# Patient Record
Sex: Female | Born: 1975 | Race: White | Hispanic: No | Marital: Married | Smoking: Former smoker
Health system: Southern US, Community
[De-identification: ages and names within clinical notes are randomized; demographics above are authoritative.]

## PROBLEM LIST (undated history)

## (undated) DIAGNOSIS — E119 Type 2 diabetes mellitus without complications: Secondary | ICD-10-CM

## (undated) DIAGNOSIS — I10 Essential (primary) hypertension: Secondary | ICD-10-CM

## (undated) HISTORY — DX: Essential (primary) hypertension: I10

---

## 2002-06-15 HISTORY — PX: DILATION AND CURETTAGE OF UTERUS: SHX78

## 2003-01-13 HISTORY — PX: CERVICAL CERCLAGE: SHX1329

## 2006-12-22 ENCOUNTER — Ambulatory Visit: Payer: Self-pay | Admitting: Internal Medicine

## 2007-03-24 ENCOUNTER — Ambulatory Visit: Payer: Self-pay | Admitting: Gynecology

## 2007-03-25 ENCOUNTER — Encounter: Admission: RE | Admit: 2007-03-25 | Discharge: 2007-03-25 | Payer: Self-pay | Admitting: Gynecology

## 2007-05-04 ENCOUNTER — Encounter (INDEPENDENT_AMBULATORY_CARE_PROVIDER_SITE_OTHER): Payer: Self-pay | Admitting: Gynecology

## 2007-05-04 ENCOUNTER — Ambulatory Visit: Payer: Self-pay | Admitting: Gynecology

## 2007-08-23 ENCOUNTER — Ambulatory Visit: Payer: Self-pay

## 2007-10-10 ENCOUNTER — Ambulatory Visit: Payer: Self-pay | Admitting: Gynecology

## 2008-06-13 ENCOUNTER — Ambulatory Visit: Payer: Self-pay | Admitting: Sports Medicine

## 2009-05-29 ENCOUNTER — Ambulatory Visit: Payer: Self-pay | Admitting: Family Medicine

## 2010-06-11 ENCOUNTER — Other Ambulatory Visit (HOSPITAL_COMMUNITY)
Admission: RE | Admit: 2010-06-11 | Discharge: 2010-06-11 | Disposition: A | Discharge status: Home / Self Care | Payer: 59 | Source: Ambulatory Visit | Attending: Family Medicine | Admitting: Family Medicine

## 2010-06-11 ENCOUNTER — Encounter: Payer: Self-pay | Admitting: Family Medicine

## 2010-06-11 ENCOUNTER — Ambulatory Visit (INDEPENDENT_AMBULATORY_CARE_PROVIDER_SITE_OTHER): Payer: 59 | Admitting: Obstetrics & Gynecology

## 2010-06-11 DIAGNOSIS — Z124 Encounter for screening for malignant neoplasm of cervix: Secondary | ICD-10-CM

## 2010-06-11 DIAGNOSIS — Z01419 Encounter for gynecological examination (general) (routine) without abnormal findings: Secondary | ICD-10-CM

## 2010-06-11 LAB — CONVERTED CEMR LAB: TSH: 1.332 microintl units/mL (ref 0.350–4.500)

## 2010-07-04 NOTE — Assessment & Plan Note (Signed)
NAME:  Melinda Sanders, Melinda Sanders                  ACCOUNT NO.:  [id number]  MEDICAL RECORD NO.:  [id number]           PATIENT TYPE:  LOCATION:  [facility] at [facility]           FACILITY:  PHYSICIAN:  Tinnie Gens, MD             DATE OF BIRTH:  DATE OF SERVICE:  06/11/2010                                 CLINIC NOTE  CHIEF COMPLAINT:  Yearly exam.  HISTORY OF PRESENT ILLNESS:  The patient is a 35 year old gravida 2, para 0-1-1-1 who has complicated OB history that included a 20-week loss followed by a 31-week PPROM delivery after prophylactic cerclage.  The patient underwent cesarean delivery at that time.  She had a D and C after her 20-week loss.  At her last yearly approximately 1 year ago, we removed her IUD.  She was having regular cycles pretty much since that happened and has been trying to conceive without any lot.  Her cycles have been fairly regular with the exception of one cycle that was 9 days late in January with a negative EPT.  She then had a normal cycle in February and she is about day 20 today from that.  PAST MEDICAL HISTORY:  Depression, migraine headaches, allergic rhinitis.  PAST SURGICAL HISTORY:  D and C for 20-week loss, cerclage last pregnancy and primary low-transverse C-section for labor with cerclage in place.  MEDICATIONS:  Singulair one p.o. daily and phentermine.  ALLERGIES:  No known drug allergies.  She is not allergic to latex.  OBSTETRICAL HISTORY:  She is G1 with a 20-week loss, G2, cerclage at 12 weeks, PPROM at 30 weeks with delivery via C-section.  GYN HISTORY:  No history of abnormal Pap smear.  FAMILY HISTORY:  Hypertension in her father.  Diabetes in mom and grandmom.  Breast cancer in maternal and paternal grandmothers.  SOCIAL HISTORY:  She works.  She denies tobacco, drug use.  She is a social alcohol user.  She is married.  She and her husband have had some difficulties, but are back home for approximately 3 years.  REVIEW OF SYSTEMS:   Fourteen-point review of systems reviewed.  She denies headache, vision changes, fevers, chills, nausea, vomiting, diarrhea, constipation, blood in her stool, blood in her urine, chest pain, shortness of breath, abdominal pain.  PHYSICAL EXAMINATION:  VITAL SIGNS:  Her blood pressure today is 143/102, weight is 190. GENERAL:  She is a well-developed and well-nourished female in no acute distress. HEENT:  Normocephalic, atraumatic.  Sclerae anicteric. NECK:  Supple.  Normal thyroid. LUNGS:  Clear bilaterally. CV:  Regular rate and rhythm without rubs, gallops, or murmurs. ABDOMEN:  Soft, nontender, and nondistended. EXTREMITIES:  No cyanosis, clubbing, or edema, 2+ distal pulses. BREASTS:  Symmetric with everted nipples.  No masses.  No supraclavicular or axillary adenopathy. GU:  In the external genitalia, she looks like she has chronic yeast infection with skin discoloration, pinkness, and a little bit of lichenification.  BUS is normal.  Vagina is pink and rugated.  Cervix is nulliparous without lesions.  Uterus was mildly tender anteriorly. Really cannot be well defined on bimanual given body habitus.  The adnexa did not feel enlarged but  were also not really well appreciated either.  IMPRESSION: 1. GYN exam with Pap. 2. Elevated blood pressure. 3. Secondary infertility. 4. Tinea cruris.  PLAN: 1. Pap smear today. 2. Check itraconazole for yeast. 3. Check HSG. 4. Check semen analysis. 5. Check labs to include a TSH, progesterone, AMH at day 21     essentially progesterone. 6. PCP referral for elevated blood pressure.  Once we get these laboratories and studies back, we will sit down again with the patient and decide about referral to Reproductive Endocrinology.          ______________________________ Tinnie Gens, MD    TP/MEDQ  D:  06/11/2010  T:  06/12/2010  Job:  [id number]

## 2010-07-24 ENCOUNTER — Inpatient Hospital Stay (HOSPITAL_COMMUNITY)
Admission: AD | Admit: 2010-07-24 | Discharge: 2010-07-25 | Disposition: A | Discharge status: Home / Self Care | Payer: 59 | Source: Ambulatory Visit | Attending: Obstetrics and Gynecology | Admitting: Obstetrics and Gynecology

## 2010-07-24 DIAGNOSIS — O200 Threatened abortion: Secondary | ICD-10-CM

## 2010-07-25 ENCOUNTER — Inpatient Hospital Stay (HOSPITAL_COMMUNITY): Payer: 59

## 2010-07-25 ENCOUNTER — Encounter (HOSPITAL_COMMUNITY): Payer: Self-pay | Admitting: Radiology

## 2010-07-25 LAB — CBC
HCT: 36.6 % (ref 36.0–46.0)
MCH: 30.4 pg (ref 26.0–34.0)
MCHC: 35 g/dL (ref 30.0–36.0)
MCV: 86.9 fL (ref 78.0–100.0)
RBC: 4.21 MIL/uL (ref 3.87–5.11)
RDW: 13.4 % (ref 11.5–15.5)

## 2010-08-04 ENCOUNTER — Encounter (HOSPITAL_COMMUNITY)
Admission: RE | Admit: 2010-08-04 | Discharge: 2010-08-04 | Disposition: A | Discharge status: Home / Self Care | Payer: 59 | Source: Ambulatory Visit | Attending: Obstetrics and Gynecology | Admitting: Obstetrics and Gynecology

## 2010-08-04 LAB — CBC
HCT: 40.3 % (ref 36.0–46.0)
Hemoglobin: 13.7 g/dL (ref 12.0–15.0)
MCV: 88.6 fL (ref 78.0–100.0)
WBC: 8 10*3/uL (ref 4.0–10.5)

## 2010-08-06 ENCOUNTER — Ambulatory Visit (HOSPITAL_COMMUNITY)
Admission: RE | Admit: 2010-08-06 | Discharge: 2010-08-06 | Disposition: A | Discharge status: Home / Self Care | Payer: 59 | Source: Ambulatory Visit | Attending: Obstetrics and Gynecology | Admitting: Obstetrics and Gynecology

## 2010-08-06 DIAGNOSIS — O3430 Maternal care for cervical incompetence, unspecified trimester: Secondary | ICD-10-CM | POA: Insufficient documentation

## 2010-08-06 DIAGNOSIS — Z01812 Encounter for preprocedural laboratory examination: Secondary | ICD-10-CM | POA: Insufficient documentation

## 2010-08-06 DIAGNOSIS — Z01818 Encounter for other preprocedural examination: Secondary | ICD-10-CM | POA: Insufficient documentation

## 2010-08-06 HISTORY — PX: CERVICAL CERCLAGE: SHX1329

## 2010-08-26 ENCOUNTER — Other Ambulatory Visit (HOSPITAL_COMMUNITY): Payer: Self-pay | Admitting: Obstetrics and Gynecology

## 2010-08-26 DIAGNOSIS — R772 Abnormality of alphafetoprotein: Secondary | ICD-10-CM

## 2010-08-26 NOTE — Assessment & Plan Note (Signed)
NAME:  Melinda Sanders, Melinda Sanders                  ACCOUNT NO.:  [id number]   MEDICAL RECORD NO.:  [id number]          PATIENT TYPE:  POB   LOCATION:  [facility] at [facility]         FACILITY:  [facility]   PHYSICIAN:  Tinnie Gens, MD        DATE OF BIRTH:  12/05/1975   DATE OF SERVICE:  05/29/2009                                  CLINIC NOTE   CHIEF COMPLAINT:  Yearly exam.   HISTORY OF PRESENT ILLNESS:  The patient is a 35 year old gravida 2,  para 0-1-1-1 who has a very complicated OB history and has an IUD in at  present.  She mentioned conceiving another child.  Her last child was  born 6 years ago at 31 weeks after a PPROM.  She had a cerclage at that  pregnancy because she has a history of her prior 20-week loss.  A  lengthy discussion was had with the patient about this and her risks and  the risks involved.  I think she is adequately prepared and knows what  to expect.  We did discuss 17P, preconception counseling, getting off  her medications and starting prenatal vitamins prior to conception.  She  thinks she wants to try to start conceiving in May or June 2011.  She  has no cycle with her IUD and she has pain rarely and otherwise is  without complaint today.   PAST MEDICAL HISTORY:  She has depression, some migraine headaches and  allergies.   MEDICATIONS:  She is on;  1. Singulair 1 p.o. daily.  2. Zoloft 50 mg 1 p.o. daily.   ALLERGIES:  None known.  She is not allergic to latex.   PAST SURGICAL HISTORY:  She had a D and C for the placenta after a 20-  week loss, Shirodkar cerclage with her last pregnancy and a primary low  transverse C-section for labor and a cerclage in place.   SOCIAL HISTORY:  The patient works for a [employer].  She  denies tobacco, social alcohol use.   FAMILY HISTORY:  Significant for hypertension in her father, diabetes in  her mom and grand mom.  Breast cancer in a maternal and paternal  grandmothers.   OBSTETRICAL HISTORY:  G1, 20-week loss due  to cerclage at 12 weeks,  PPROM at 30 weeks with delivery via C-section.   GYNECOLOGIC HISTORY:  No history of abnormal Pap smear.   14-point review of systems reviewed.  She denies fevers, chills,  headache, vision changes, nausea, vomiting, diarrhea, constipation,  blood in her stool, blood in her urine.   PHYSICAL EXAMINATION:  VITAL SIGNS:  Today, weight is 207 pounds, blood  pressure is 144/100, pulse is 88, height is 5 feet 4 inches.  GENERAL:  She is a moderately obese female in no acute distress.  HEENT:  Normocephalic, atraumatic.  Sclerae anicteric.  NECK:  Supple.  Normal thyroid.  LUNGS:  Clear bilaterally.  CV:  Regular rate and rhythm.  No rubs, gallops, or murmurs.  ABDOMEN:  Soft, nontender, nondistended.  EXTREMITIES:  No cyanosis, clubbing or edema.  BREASTS:  Symmetric with everted nipples.  No masses.  No  supraclavicular or axillary  adenopathy.  GU:  Normal external female genitalia.  BUS is normal.  Vagina is pink  and rugated.  Cervix is nulliparous without lesions.  Uterus is small,  anteverted.  No adnexal mass or tenderness.   PROCEDURE:  IUD string was easily visualized, was grasped with a ring  forceps and removed without difficulty.   IMPRESSION:  1. Gynecologic exam with Pap smear.  2. Intrauterine device removal.   PLAN:  Preconception counseling done.  Followup Pap next year.  Come  back with pregnancy.           ______________________________  Tinnie Gens, MD     TP/MEDQ  D:  05/29/2009  T:  05/30/2009  Job:  [id number]

## 2010-08-26 NOTE — Op Note (Signed)
  NAME:  Melinda Sanders, Melinda Sanders                  ACCOUNT NO.:  [id number]  MEDICAL RECORD NO.:  [id number]           PATIENT TYPE:  O  LOCATION:  [facility]                          FACILITY:  [facility]  PHYSICIAN:  Guy Sandifer. Henderson Cloud, M.D. DATE OF BIRTH:  May 23, 1975  DATE OF PROCEDURE:  08/06/2010 DATE OF DISCHARGE:                              OPERATIVE REPORT   PREOPERATIVE DIAGNOSIS:  Incompetent cervix at approximately 73 weeks' estimated gestational age.  POSTOPERATIVE DIAGNOSIS:  Incompetent cervix at approximately 11 weeks' estimated gestational age.  PROCEDURE:  McDonald cervical cerclage.  SURGEON:  Guy Sandifer. Henderson Cloud, MD  ANESTHESIA:  Spinal.  ESTIMATED BLOOD LOSS:  Minimal.  INDICATIONS AND CONSENT:  This patient is a 35 year old married white female at approximately 104 weeks' estimated gestational age with known incompetent cervix.  Recent ultrasound in the office revealed a cervix measuring 4.7 cm in length.  The patient is without bleeding or leaking. Options have been discussed and cervical cerclage has been reviewed preoperatively.  Potential risks and complications have been reviewed preoperatively including but limited to infection, organ damage, bleeding requiring transfusion of blood products with HIV and hepatitis acquisition, DVT, PE, and pneumonia.  Possibility of ruptured membranes or infection leading to pregnancy loss have been reviewed.  Additional complications of incompetent cervix, including early admission, premature delivery, prolonged bed rest, have been reviewed as well.  All questions have been answered, and consent is signed on the chart.  PROCEDURE:  Fetal heartbeat was noted in the preoperative holding area. The patient was transferred to the operating room.  She was identified. Spinal anesthetic was placed.  She was placed in the dorsal lithotomy position.  Time-out was undertaken.  She was prepped, bladder straight catheterized, and she was draped in a sterile  fashion.  Exam reveals cervix to be closed and thick.  Weighted retractor was placed.  Using a 5-mm Mersilene band, a cerclage was placed beginning at the 12 o'clock position in a counterclockwise direction, again exiting at the 12 o'clock position.  This was done without difficulty.  This was tied down and the excess length was trimmed.  Good hemostasis was noted.  Exam reveals the cervix to be closed and thick.  All counts correct, and the patient is taken to recovery room in stable condition.     Guy Sandifer Henderson Cloud, M.D.     JET/MEDQ  D:  08/06/2010  T:  08/06/2010  Job:  [id number]  Electronically Signed by Harold Hedge M.D. on 08/26/2010 01:50:38 PM

## 2010-08-26 NOTE — Assessment & Plan Note (Signed)
NAME:  Melinda Sanders, Melinda Sanders                  ACCOUNT NO.:  [id number]   MEDICAL RECORD NO.:  [id number]          PATIENT TYPE:  POB   LOCATION:  [facility] at [facility]         FACILITY:  [facility]   PHYSICIAN:  Ginger Carne, MD DATE OF BIRTH:  27-Mar-1976   DATE OF SERVICE:                                  CLINIC NOTE   [name] is here today for placement of a Mirena intrauterine device.  She has noted that when she is off of the patches or oral contraception,  her blood pressure reverts to normal.  It was determined that it is  possible, that her hypertension is contraceptive related.  For this  reason,  the patient stopped the NuvaRing, which she had been on most  recently, several days ago and a Mirena IUD was placed today without  difficulty.  She sounded to 8 cm.  I have asked her to have her blood  pressure rechecked in approximately 3 weeks, here at the office to see  if her pressure has responded.  At the moment, she is not on  antihypertensive medication.  Today's blood pressure was 139/91, and  once her blood pressure is re-determined, consideration will be given as  whether she is a candidate for antihypertensive medication.           ______________________________  Ginger Carne, MD     SHB/MEDQ  D:  10/10/2007  T:  10/11/2007  Job:  [id number]

## 2010-10-01 ENCOUNTER — Other Ambulatory Visit (HOSPITAL_COMMUNITY): Payer: Self-pay | Admitting: Obstetrics and Gynecology

## 2010-10-01 ENCOUNTER — Ambulatory Visit (HOSPITAL_COMMUNITY)
Admission: RE | Admit: 2010-10-01 | Discharge: 2010-10-01 | Disposition: A | Discharge status: Home / Self Care | Payer: 59 | Source: Ambulatory Visit | Attending: Obstetrics and Gynecology | Admitting: Obstetrics and Gynecology

## 2010-10-01 DIAGNOSIS — R772 Abnormality of alphafetoprotein: Secondary | ICD-10-CM

## 2010-10-01 DIAGNOSIS — O09529 Supervision of elderly multigravida, unspecified trimester: Secondary | ICD-10-CM | POA: Insufficient documentation

## 2010-10-01 DIAGNOSIS — O358XX0 Maternal care for other (suspected) fetal abnormality and damage, not applicable or unspecified: Secondary | ICD-10-CM | POA: Insufficient documentation

## 2010-10-24 ENCOUNTER — Inpatient Hospital Stay (HOSPITAL_COMMUNITY)
Admission: AD | Admit: 2010-10-24 | Discharge: 2010-10-27 | DRG: 782 | Disposition: A | Discharge status: Home / Self Care | Payer: 59 | Source: Ambulatory Visit | Attending: Obstetrics and Gynecology | Admitting: Obstetrics and Gynecology

## 2010-10-24 ENCOUNTER — Encounter (HOSPITAL_COMMUNITY): Payer: Self-pay | Admitting: *Deleted

## 2010-10-24 DIAGNOSIS — O3430 Maternal care for cervical incompetence, unspecified trimester: Principal | ICD-10-CM | POA: Diagnosis present

## 2010-10-24 MED ORDER — PRENATAL PLUS 27-1 MG PO TABS
1.0000 | ORAL_TABLET | Freq: Every day | ORAL | Status: DC
  Administered 2010-10-24 – 2010-10-27 (×4): 1 via ORAL
  Filled 2010-10-24 (×4): qty 1

## 2010-10-24 MED ORDER — LACTATED RINGERS IV SOLN
INTRAVENOUS | Status: DC
  Administered 2010-10-24 – 2010-10-25 (×3): via INTRAVENOUS

## 2010-10-24 MED ORDER — IBUPROFEN 800 MG PO TABS
800.0000 mg | ORAL_TABLET | Freq: Three times a day (TID) | ORAL | Status: DC
  Administered 2010-10-24 – 2010-10-27 (×7): 800 mg via ORAL
  Filled 2010-10-24 (×7): qty 1

## 2010-10-24 MED ORDER — IBUPROFEN 800 MG PO TABS
800.0000 mg | ORAL_TABLET | Freq: Three times a day (TID) | ORAL | Status: DC
  Administered 2010-10-24 (×2): 800 mg via ORAL
  Filled 2010-10-24 (×2): qty 1

## 2010-10-24 MED ORDER — CALCIUM CARBONATE ANTACID 500 MG PO CHEW: 2.0000 | CHEWABLE_TABLET | ORAL | Status: DC | PRN

## 2010-10-24 MED ORDER — ACETAMINOPHEN 325 MG PO TABS: 650.0000 mg | ORAL_TABLET | ORAL | Status: DC | PRN

## 2010-10-24 MED ORDER — DOCUSATE SODIUM 100 MG PO CAPS
100.0000 mg | ORAL_CAPSULE | Freq: Every day | ORAL | Status: DC
  Administered 2010-10-24 – 2010-10-27 (×4): 100 mg via ORAL
  Filled 2010-10-24 (×4): qty 1

## 2010-10-24 MED ORDER — ZOLPIDEM TARTRATE 10 MG PO TABS
10.0000 mg | ORAL_TABLET | Freq: Every evening | ORAL | Status: DC | PRN
  Administered 2010-10-24 – 2010-10-26 (×2): 10 mg via ORAL
  Filled 2010-10-24 (×3): qty 1

## 2010-10-24 NOTE — H&P (Signed)
Melinda Sanders is a 89 you G3p1 at 48 1/7 seen in office today with [place] eval of Incompetent cervix.  At 18 weeks had normal [place] length of 4cm.  Today US showed funneling of membranes thru cerclage (placed at 11 weeks) but had 1 cm of cervix left beyond the funnel that was closed.  Pelvic exam revealed 3-4 cm length and closed cervix with stitch in place.  Her history is significant for C/S at 33 weeks for PPROM and a 17 week loss.  She had a cerclage placed at 11 weeks this pregnancy.  She desires aggressive inpatient management  IMP  Plan Admit to antenatal for Trend[place]burg, BR, Motrin 800 TID x 3 days and rescan in 3 days for treatement of cervical change with funneling.  Cerclage in place. Also of note, previous US showed an anterior placenta previa that was not seen today on vaginal Korea of cervix. Candice Camp, MD

## 2010-10-25 MED ORDER — SODIUM CHLORIDE 0.9 % IJ SOLN
3.0000 mL | Freq: Two times a day (BID) | INTRAMUSCULAR | Status: DC
  Administered 2010-10-25: 3 mL via INTRAVENOUS

## 2010-10-25 NOTE — Progress Notes (Signed)
RN to bedside to given 1800 medication.  Doppler fetal heart rate 147 bpm, pt c/o not able to use BSC for bm, desires to get up and go to BR.  toco disconnected so pt. Can go to BR for BM.  Fetal HR checked prior to pt. Going to BR.

## 2010-10-25 NOTE — Progress Notes (Signed)
  Pt is without complaints.  Reports GFM and no Ctxs VSSAF No Ctxs FHR + Abd soft/Gravid Inc Cx with change Continue present care DL

## 2010-10-26 NOTE — Progress Notes (Signed)
  Pt without complaints VSSAF FHR + Ctxs none Abd soft, gravid Incompetent cervix.  Cont Motrin x 3 days (finish tomorrow) Korea of cervix in am Cerclage in place DL

## 2010-10-27 ENCOUNTER — Encounter (HOSPITAL_COMMUNITY): Payer: 59

## 2010-10-27 ENCOUNTER — Inpatient Hospital Stay (HOSPITAL_COMMUNITY): Payer: 59

## 2010-10-27 NOTE — Discharge Summary (Signed)
Obstetric Discharge Summary Reason for Admission: shortened cervix Prenatal Procedures: bedrest, cervical ultrasound     Hemoglobin  Date Value Range Status  08/04/2010 13.7  12.0-15.0 (g/dL) Final     HCT  Date Value Range Status  08/04/2010 40.3  36.0-46.0 (%) Final    Discharge Diagnoses: Incompetent cervix  Discharge Information: Date: 10/27/2010 Activity: pelvic rest and bedrest Diet: routine Medications: PNV and Colace Condition: stable Instructions: refer to practice specific booklet Discharge to: home      Melinda Sanders 10/27/2010, 8:55 AM

## 2010-10-27 NOTE — Progress Notes (Signed)
UR chart review completed.  

## 2010-10-27 NOTE — Progress Notes (Signed)
Radiology tech at the bedside for U/S transvaginal.

## 2010-10-27 NOTE — Progress Notes (Signed)
Discharge instructions reviewed with patient and (spouse at the bedside).  Copy given along with MD follow up instructions. Pt. Verbalized understanding of [facility] instructions, along with other discharge instructions (vaginal leaking, bleeding, ctxs, and decreased fetal movement etc... - copy given.

## 2010-10-27 NOTE — Progress Notes (Signed)
Pt. Removed from Toco - to shower (shower chair used). (VO received - OK for pt. To take shower - in chair; per Dr. Renaldo Fiddler.

## 2010-10-27 NOTE — Progress Notes (Signed)
Pt w/out complaints.  Good FM.  No ctx, vb, or LOF.  AFVSS, +FHT Gen: NAD Abd:  Gravid, NT PV:  Deferred  Pelvic US:  Cervical length unchanged per preliminary report.  Final report pending.   A/P:  D/C home Bedrest F/u office for rpt cervix length this week

## 2010-10-28 ENCOUNTER — Encounter (HOSPITAL_COMMUNITY): Payer: 59

## 2010-10-29 ENCOUNTER — Ambulatory Visit (HOSPITAL_COMMUNITY): Admission: RE | Admit: 2010-10-29 | Payer: 59 | Source: Ambulatory Visit

## 2010-10-30 ENCOUNTER — Inpatient Hospital Stay (HOSPITAL_COMMUNITY)
Admission: AD | Admit: 2010-10-30 | Discharge: 2011-01-16 | DRG: 765 | Disposition: A | Discharge status: Home / Self Care | Payer: 59 | Attending: Obstetrics and Gynecology | Admitting: Obstetrics and Gynecology

## 2010-10-30 ENCOUNTER — Encounter (HOSPITAL_COMMUNITY): Payer: Self-pay | Admitting: *Deleted

## 2010-10-30 DIAGNOSIS — O26879 Cervical shortening, unspecified trimester: Secondary | ICD-10-CM | POA: Diagnosis present

## 2010-10-30 DIAGNOSIS — O3430 Maternal care for cervical incompetence, unspecified trimester: Principal | ICD-10-CM | POA: Diagnosis present

## 2010-10-30 DIAGNOSIS — N883 Incompetence of cervix uteri: Secondary | ICD-10-CM

## 2010-10-30 DIAGNOSIS — Z98891 History of uterine scar from previous surgery: Secondary | ICD-10-CM

## 2010-10-30 DIAGNOSIS — O34219 Maternal care for unspecified type scar from previous cesarean delivery: Secondary | ICD-10-CM | POA: Diagnosis present

## 2010-10-30 DIAGNOSIS — Z302 Encounter for sterilization: Secondary | ICD-10-CM

## 2010-10-30 MED ORDER — PRENATAL PLUS 27-1 MG PO TABS: 1.0000 | ORAL_TABLET | Freq: Every day | ORAL | Status: DC

## 2010-10-30 MED ORDER — PRENATAL PLUS 27-1 MG PO TABS
1.0000 | ORAL_TABLET | Freq: Every day | ORAL | Status: DC
  Administered 2010-10-30 – 2011-01-12 (×75): 1 via ORAL
  Filled 2010-10-30 (×77): qty 1

## 2010-10-30 MED ORDER — CALCIUM CARBONATE ANTACID 500 MG PO CHEW
2.0000 | CHEWABLE_TABLET | ORAL | Status: DC | PRN
  Administered 2010-11-25 – 2011-01-02 (×5): 400 mg via ORAL
  Filled 2010-10-30 (×5): qty 2

## 2010-10-30 MED ORDER — DOCUSATE SODIUM 100 MG PO CAPS
100.0000 mg | ORAL_CAPSULE | Freq: Every day | ORAL | Status: DC
  Administered 2010-10-30 – 2011-01-12 (×71): 100 mg via ORAL
  Filled 2010-10-30 (×74): qty 1

## 2010-10-30 MED ORDER — ZOLPIDEM TARTRATE 10 MG PO TABS
10.0000 mg | ORAL_TABLET | Freq: Every evening | ORAL | Status: DC | PRN
  Administered 2010-11-03 – 2011-01-12 (×52): 10 mg via ORAL
  Filled 2010-10-30 (×54): qty 1

## 2010-10-30 MED ORDER — HYDROXYPROGESTERONE CAPROATE 250 MG/ML IM OIL
250.0000 mg | TOPICAL_OIL | INTRAMUSCULAR | Status: AC
  Administered 2010-10-31 – 2011-01-02 (×10): 250 mg via INTRAMUSCULAR
  Filled 2010-10-30 (×12): qty 1

## 2010-10-30 MED ORDER — DOCUSATE SODIUM 100 MG PO CAPS: 100.0000 mg | ORAL_CAPSULE | Freq: Every day | ORAL | Status: DC

## 2010-10-30 MED ORDER — ACETAMINOPHEN 325 MG PO TABS
650.0000 mg | ORAL_TABLET | ORAL | Status: DC | PRN
  Administered 2010-11-04 – 2011-01-03 (×9): 650 mg via ORAL
  Filled 2010-10-30 (×9): qty 2

## 2010-10-30 MED ORDER — BETAMETHASONE SOD PHOS & ACET 6 (3-3) MG/ML IJ SUSP: 12.0000 mg | INTRAMUSCULAR | Status: DC

## 2010-10-30 NOTE — H&P (Signed)
   Chief complaint: Incompetent cervix, shortened cervix.  History of present illness:  35 year old G3 P0 111 ED D. 02/26/2011, approximately 23 weeks, this patient was hospitalized 713 when a routine ultrasound in our office demonstrated that her cervix had decreased from 4.9 cm to a cervical length of 1.3 cm. She previously had a cerclage placed at this pregnancy of 08/06/2010 by Dr. Huntley Dec. She was observed over the weekend, discharged this past Monday.  She presented to the office for routine check today with her husband and while her cervix was stable on exam, namely 1.3 cm with some funneling the, the cerclage was intact but they would both feel better about hospital care until past the point of viability. She is admitted for monitoring, betamethasone at 24 weeks, and her weekly progesterone.  Past medical history:  Pregnancy history she had a D&E in 2004 for a 17 week fetal loss. Cesarean section 2005 31 weeks for P. prom she did have a cerclage with that pregnancy. For the remainder of her PMH please see the [facility] statical form. Physical exam Temp 98 2 blood pressure 130/72 HEENT unremarkable, neck supple without masses.  Lungs clear Cardiovascular: Regular rate and rhythm without murmurs rubs or gallops noted. Breasts: Not examined Abdominal exam: Fetal heart rate 142 fundal height 22 cm. Cervical exam: Cervix is closed the cerclage was intact. Extremities/neurologic exam: Unremarkable.  Impression: 123 week IUP 2. Cerclage intact. 3. Shortened cervix with some funneling  Plan: We'll admit for bedrest, betamethasone at 24 weeks, continue her weekly progesterone prophylaxis. Dictated with [employer], and not proofread. Shemicka Cohrs M. Milana Obey.D.

## 2010-10-31 NOTE — Progress Notes (Signed)
  23 1/7  No c/o ctx, resting in slight T-berg FHR stable...for P4 today.Marland KitchenMarland KitchenBMZ @ 24

## 2010-10-31 NOTE — Plan of Care (Signed)
Problem: Consults Goal: Birthing Suites Patient Information Press F2 to bring up selections list  Outcome: Progressing  Pt < [redacted] weeks EGA     

## 2010-11-01 NOTE — Progress Notes (Signed)
Today 23 2/7  No c/o, resting comfortably FHR stable For Korea to check cx 7/25, BMZ series @ 24 weeks.

## 2010-11-02 ENCOUNTER — Encounter (HOSPITAL_COMMUNITY): Payer: Self-pay | Admitting: Anesthesiology

## 2010-11-02 ENCOUNTER — Inpatient Hospital Stay (HOSPITAL_COMMUNITY): Payer: 59 | Admitting: Anesthesiology

## 2010-11-02 NOTE — Progress Notes (Signed)
23 3/7 today.  Rested well last night, good FM, no other changes.

## 2010-11-02 NOTE — Anesthesia Preprocedure Evaluation (Deleted)
Anesthesia Evaluation  Name, MR# and DOB Patient awake  General Assessment Comment  Reviewed: Allergy & Precautions, H&P  and Patient's Chart, lab work & pertinent test results  Airway Mallampati: II TM Distance: >3 FB Neck ROM: full    Dental No notable dental hx    Pulmonaryneg pulmonary ROS    clear to auscultation  pulmonary exam normal   Cardiovascular hypertension, regular Normal   Neuro/PsychNegative Neurological ROS Negative Psych ROS  GI/Hepatic/Renal negative GI ROS, negative Liver ROS, and negative Renal ROS (+)       Endo/Other  Negative Endocrine ROS (+)   Abdominal   Musculoskeletal  Hematology negative hematology ROS (+)   Peds  Reproductive/Obstetrics (+) Pregnancy   Anesthesia Other Findings Likely pre-eclampsia            Anesthesia Physical Anesthesia Plan  ASA: III and Emergent  Anesthesia Plan: Spinal   Post-op Pain Management:    Induction:   Airway Management Planned:   Additional Equipment:   Intra-op Plan:   Post-operative Plan:   Informed Consent: I have reviewed the patients History and Physical, chart, labs and discussed the procedure including the risks, benefits and alternatives for the proposed anesthesia with the patient or authorized representative who has indicated his/her understanding and acceptance.     Plan Discussed with:   Anesthesia Plan Comments:        waiting for platelets... ga vs sab Anesthesia Quick Evaluation

## 2010-11-03 NOTE — Progress Notes (Signed)
UR chart review completed.  

## 2010-11-03 NOTE — Progress Notes (Signed)
Cont MBR, no bleeding/leaking  VSS Afeb FHT pos  Continue MBR BMZ ordered at 24 weeks U/S 11/05/10 to check cx and finish cardiac views

## 2010-11-04 NOTE — Progress Notes (Signed)
  Pt without complaints.  GFM.  No ctxs  VSSAF FHR + Ctxs none Abd soft, gravid nontender  IUP at 23 5/7 Inc Cx with cx change Korea tomorrow BMZ at 24 weeks Inpatient until 28 weeks DL

## 2010-11-05 ENCOUNTER — Ambulatory Visit (HOSPITAL_COMMUNITY): Payer: 59

## 2010-11-05 NOTE — Progress Notes (Signed)
  sono today no measurable cx.  No ctx's noted. Will change to complete bedrest.

## 2010-11-05 NOTE — Progress Notes (Signed)
Pt not wearing SCD's .  Stated that she did wear them some during day and desires to have them off at night.

## 2010-11-05 NOTE — Progress Notes (Signed)
  S:  No ctx's, leaking or bleeding O:  Afebrile vss       Gravid uterus nontender  fhr 140's good fetal movement A:  IUP at 23 6/7 with incompetent cx P:  Continue bedrest  bmx tomorrow

## 2010-11-06 DIAGNOSIS — Z98891 History of uterine scar from previous surgery: Secondary | ICD-10-CM

## 2010-11-06 DIAGNOSIS — N883 Incompetence of cervix uteri: Secondary | ICD-10-CM | POA: Diagnosis present

## 2010-11-06 MED ORDER — BETAMETHASONE SOD PHOS & ACET 6 (3-3) MG/ML IJ SUSP
12.5000 mg | Freq: Once | INTRAMUSCULAR | Status: AC
  Administered 2010-11-07: 12.5 mg via INTRAMUSCULAR
  Filled 2010-11-06: qty 2.1

## 2010-11-06 MED ORDER — BETAMETHASONE SOD PHOS & ACET 6 (3-3) MG/ML IJ SUSP
12.5000 mg | Freq: Once | INTRAMUSCULAR | Status: AC
  Administered 2010-11-06: 12.5 mg via INTRAMUSCULAR
  Filled 2010-11-06: qty 2.1

## 2010-11-06 NOTE — Progress Notes (Signed)
  S:   Patient is resting, in Trendelenberg,  Denies pain, bleeding.  O:   AF VSS +FHT last night abd soft non tender No vag discharge  A: IUP EDC 02/26/2011 Cervical incompetence Status post cerclage History of C-section  P: Ultrasound yesterday showed open cervix No measurable cervix. Plan continue aggressive management  Continue delalutin Continue steroid series

## 2010-11-06 NOTE — Progress Notes (Signed)
UR Chart review completed.  

## 2010-11-06 NOTE — Progress Notes (Signed)
CTSP because she complained of a lot of pressure in vagina Felt like the baby is coming out  Af vss Careful exam Cervix 90 percent/1 cm /cerclage feels tight i cannot palpate any bag of water or fetal parts in the vagina  reasurred the patient Continue to monitor Will give her the steroids today

## 2010-11-07 NOTE — Progress Notes (Signed)
Pt is resting in t-burg, no pain or ctx.  No vb.  Good fm  AF, VSS Gen - NAD Abd - NT PV - deferred  A/P:  Cervical incompetence w/ cerclage & no measurable cvx S/p BMZ x 1 - repeat today Continue delalutin Bedrest SCDs

## 2010-11-07 NOTE — Progress Notes (Signed)
Pt at [redacted] weeks gestation, adm with incompetent cervix and cerclage. Ht 64.5 inches, weight 195 lbs with no weight gain. BMI 32.9, obese. Typical weight goals 11 - 20 lbs. Pt reports she had no weight gain with previous pregnancy.  Regular diet tolerated well and appetite is good. No problems eating in trendelenburg position. Estimated needs 21 - 2300 Kcal/day, 75 - 85 gm. protein/day. Fluids 2.3 - 2.5 liters. No abn nutr labs  Nutrition Dx: Increased nutrient needs r/t pregnancy and fetal growth requirements aeb [redacted] weeks gestation  No educational needs at this time

## 2010-11-08 NOTE — Progress Notes (Signed)
Pt w/ c/o increase vaginal discharge overnight, none currently.  No ctx or vb.  Good FM.  No vaginal irritation or itching  AF, VSS FHT reassuring Toco neg Abd, gravid, nt PV deferred  A/p:  Continue bedrest and progesterone. S/p BMZ Watch d/c closely - nitrozine swab if persists and is clear

## 2010-11-08 NOTE — Progress Notes (Signed)
Pt. Has no IV access - no order. Stable patient.

## 2010-11-09 NOTE — Progress Notes (Signed)
No complaints.  Good fm.  Denies ctx and vb.  No LOF  AF, VSS Gen - NAD Abd - gravid, NT  A/p:  incomp cvx/cerclage Continue current plan - bedrest, progesterone S/p BMZ

## 2010-11-09 NOTE — Progress Notes (Signed)
RN to the bedside to adjust EFM.

## 2010-11-10 NOTE — Progress Notes (Signed)
  Pt without complaints VSSAF No Ctxs FHR + LE:  DTRs 1/4 Neg homans  IUP at 24 4/7 Inc Cx.  S/P BMZ Cont present care DL

## 2010-11-11 NOTE — Progress Notes (Signed)
Spiritual Care - Referral from RN - visited at 11:15 am to offer emotional/spiritual support.  Patients reports she has been on bedrest since 21 weeks and now in hospital for 4 weeks.  She reports was on bedrest for 10 weeks with her first child, now 35 yrs old.  She reports good support from family.  She works with [employer]. and is able to accomplish work by computer here.  Family visits and her work are helping her cope.  She has very cheerful and positive demeanor.  Dory Horn, Chaplain

## 2010-11-11 NOTE — Progress Notes (Signed)
Patient is without complaints. Reports Good FM.  AF VSS No ucs  Abd soft , nt, nd  IUP at 24 5/7 Cervical Incompetence Cerclage in place  Continue Progesterone Continue bedrest Status post Steroids Stable

## 2010-11-11 NOTE — Progress Notes (Signed)
UR Chart review completed.  

## 2010-11-12 NOTE — Progress Notes (Signed)
Obix launched from terminal outside of pt's room and recording 10 min of 30'min NST.

## 2010-11-12 NOTE — Progress Notes (Signed)
Unable to Launch Obix due to system stating bug.  Paper launched from [facility] monitor and recording.

## 2010-11-12 NOTE — Progress Notes (Signed)
Now 24 6/7 weeks.Marland KitchenMarland KitchenNo  C/o today, good FM. Cont BR/obsv.

## 2010-11-13 NOTE — Progress Notes (Signed)
No complaints today.  Good fm.  FHT + S/p BMZ  A/p:  Incompetent cvx w/ cerclage  Continue bedrest and progesterone Will do GTT 26-27weeks since just received steroids

## 2010-11-14 NOTE — Progress Notes (Signed)
  S:  NO LEAKAGE OR CTX O:  AFEBRILE VSS  UTERUS NONTENDER POST FHT.                                                                                             A:  IUP AT 25 1/7 WITH INCOMPETENT CX P:  CONINUE BED REST

## 2010-11-14 NOTE — Progress Notes (Signed)
UR Chart review completed.  

## 2010-11-15 NOTE — Progress Notes (Signed)
Pt upset and talking about personal family issues.

## 2010-11-15 NOTE — Progress Notes (Signed)
  S:  GOOD FETAL MOVEMENT.  NO CTX'S AND NO LEAKAGE OF FLUID O:  AFEBRILE VSS       GRAVID UTERUS NONTENDER.  POST. FHT.  A: IUP AT 25 2/7 WITH INCOMPETENT CX P:  CONTINUE BEDREST

## 2010-11-16 NOTE — Progress Notes (Signed)
RN to the bedside, noon VSS, pt. desires to have bed bath at this time and monitor baby after. RN assist pt with ADLs.

## 2010-11-16 NOTE — Progress Notes (Signed)
  S: NO CTX'S, PRESSURE OR LEAKAGE  O:  AFEBRILE VSS       GRAVID UTERUS NONTENDER FHT CHECKED LAST PM  A:  IUP AT 25 3/7 INCOMPETENT CX  P:  CONTINUE BEDREST

## 2010-11-16 NOTE — Progress Notes (Signed)
RN to the bedside, maternal pulse recording at 110-120's.  Pt. Currently texting - upset with a "friend".  EFM adjusted - 140's - 150.  Pulse ox sensor placed on pt to show maternal pulse.

## 2010-11-16 NOTE — Progress Notes (Signed)
RN to the bedside; pt asleep on right side - RR-18 bpm

## 2010-11-16 NOTE — Progress Notes (Signed)
RN to the bedside - pt continues to sleep.  RN will monitor baby at noon when vital signs are due; so as not to wake pt at this time.

## 2010-11-16 NOTE — Progress Notes (Signed)
Rn to the bedside, fetal tracing broken, maternal pulse tracing 110-120s on occasions. Audible FHR with no decel.   EFM & toco removed. Pt. Relocated EFM several times to "chase" baby.

## 2010-11-17 NOTE — Progress Notes (Signed)
UR chart review completed.  

## 2010-11-17 NOTE — Progress Notes (Signed)
25 4/7 weeks  No leaking/bleeding, +FM  VSS Afeb FHT pos last pm, will be checked this am  A: Incompetent Cx-S/P BMTZ P: Continue BR with trendelnberg position

## 2010-11-18 NOTE — Progress Notes (Addendum)
  Pt without complaints VSSAF FHR + Ctxs none Abd Gravid nt  IUP @ 25 5/7 Inc cx, stable Continue present care DL

## 2010-11-19 ENCOUNTER — Encounter (HOSPITAL_COMMUNITY): Payer: 59

## 2010-11-19 NOTE — Progress Notes (Signed)
25 6/7 weeks  C/O some pressure last pm, none now, no bleeding/leaking VSS Afeb FHT pos last pm UCs none traced Ut soft, NT  A: incompetent cx/cerclage P:Cont BR        U/S in am

## 2010-11-20 ENCOUNTER — Inpatient Hospital Stay (HOSPITAL_COMMUNITY): Payer: 59

## 2010-11-20 NOTE — Progress Notes (Signed)
UR Chart review completed.  

## 2010-11-20 NOTE — Progress Notes (Signed)
  S:  NO PRESSURE TODAY  A:  AFEBRILE VSS      GRAVID UTERUS NONTENDER  FHT 150'S      VERBAL REPORT CX LENGTH 1.7  O:  IUP AT 26 WEEKS WITH INCOMPETENT CX  P:  CONTINUE BEDREST

## 2010-11-20 NOTE — Consult Note (Addendum)
35 y.o. G 3 P 0111 26 weeks intrauterine pregnancy, S/P cerclage in April , later first trimester for history of recurrent preterm delivery.  Description of first pregnancy in 2004 included treatment for vaginal infection 2 weeks before PPROM at 17 weeks, spontaneous onset of labor at home at 18 weeks, no evaluation of placenta for infection. Plan was made for prophylactic cerclage at 12 weeks in 2nd pregnancy in 2005, but completed at 11 weeks after onset of bleeding by patient history.  PPROM occurred at 31 weeks with preterm delivery; patient is unaware if placenta was evaluated for infection. Family history of preterm birth and early pregnancy loss in patient's mother.  Two sisters have had pregnancies without evidence of PT birth, one pregnancy with fetus demonstrating clubbed feet. Cerclage place in this pregnancy, based on history of cerclage, followed by onset of contractions in July, hospitalization and management with NSAIDs for short interval, resolution of contractions, but shortening of cervix  Despite cerclage.  Hospitalized since mid July, has received ANC 2 weeks ago, maintained in Trendelenburg position, presently asymptomatic.  She has been receiving 17-P for her history. Cervix imaging today shows closed cervical length of 1.7 cm, 0.8 cm from cerclage to external os, no funneling , but no suprapubic pressure performed. She describes no discharge or tenderness. On exam, patient has no abdominal tenderness.    Assessment: History of early and mid preterm deliveries, unclear history to establish cervical insufficiency versus other causes of preterm labor (.i.e. infection, inflammation).  S/P cerclage this pregnancy, with cervical change, presently stable.  Recommendations:  Patient has received ANC, would not repeat.   Maintain weekly 17-P.   Obtain glucose screen.   Option of maintaining Trendelenberg as there is no evidence based support for this; consider allowing patient to sit, walk  around room.   Consider follow-up U/S in 3-4 weeks to reassess cervix and growth.  Twenty minutes of face to face time with patient. Thanks you for consult; Happy to follow with you.  Ronney Asters, MD  Report in [facility]EP[facility]; follow-up as suggested.  JJB

## 2010-11-20 NOTE — Progress Notes (Signed)
Report in [facility]EP[facility]; follow-up as suggested

## 2010-11-21 NOTE — Progress Notes (Signed)
Pt without complaints VSSAF FHR + Ctx none Abd Gravid NT  IUP at 26 weeks Inc Cx Cerclage Cx change Continue present care DL

## 2010-11-22 NOTE — Progress Notes (Signed)
Pt without complaints GFM VSSAF FHR+ ctxs none Abd Gravid nt  IUP at 26 2/7 Continue present care DL

## 2010-11-23 NOTE — Progress Notes (Signed)
  Pt without  Complaints GFM No ctxs VSSAF FHR+ Abd Gravid nt  IUP at 26 3/7 Inc Cx - stable on BR S/P BMZ,  Continue present care Glucola scheduled for next week DL

## 2010-11-24 NOTE — Progress Notes (Signed)
26 4/7 weeks No C/O, No leaking/bleeding, +FM  VSS afeb FHT + accels UCs none Abd NT DTR 2+ U/S vtx, no AFI, cx=1.7cm  A:Incompetent Cx P: Cont BR

## 2010-11-24 NOTE — Progress Notes (Signed)
UR chart review completed.  

## 2010-11-25 LAB — GLUCOSE TOLERANCE, 1 HOUR: Glucose, 1 Hour GTT: 130 mg/dL (ref 70–140)

## 2010-11-25 NOTE — Progress Notes (Signed)
26 5/7 today.  Good FM, no c/o ctx or leakage.  FHR stable.  Cont BR, weekly P4.

## 2010-11-26 NOTE — Progress Notes (Signed)
Patient is 26 6/7 weeks She has cerclage Here with cervical incompetence. Short cervix.  Today, the patient is resting well. No complaints. Good fm.  Af vss Uterus is non tender, soft  Imp/ iup at 26 6/7 Cervical incompetence  Plan glucola test normal yesterday Repeat steroids at 28 weeks Continue weekly progesterone and bedrest

## 2010-11-27 NOTE — Progress Notes (Signed)
No complaints.  Good FM.  No CP/SOB.  FHT +  toco neg Gen - NAD Abd - gravid, NT Ext no edema  A/P:  Continue bedrest w/ weekly progesterone Normal 1hr gtt Repeat steroids at 28wks

## 2010-11-28 MED ORDER — BETAMETHASONE SOD PHOS & ACET 6 (3-3) MG/ML IJ SUSP: 12.0000 mg | INTRAMUSCULAR | Status: DC

## 2010-11-28 NOTE — Progress Notes (Signed)
27 1/7 weeks  No pain, bleeding, leaking  FHT + Ut NT  A: Incompetent Cx-Short-Cerclage  P: Cont BR      Steroids and U/S at 28 weeks

## 2010-11-28 NOTE — Progress Notes (Signed)
UR Chart review completed.  

## 2010-11-29 NOTE — Progress Notes (Signed)
27 2/7 weeks  No bleeding/leaking  VSS/Afeb Ut soft/NT  FHT +  A: Incompetent Cx/shortening P: U/S at 28 weeks     Evaluate need for steroids at that time

## 2010-11-30 NOTE — Progress Notes (Signed)
27 3/7 weeks  No bleeding/leaking FHT + VSS Afeb Ext NT  A/P: Incompetent Cx-Short         U/S at 28 weeks, will evaluate timing for steroids based on U/S and sxs

## 2010-11-30 NOTE — Progress Notes (Signed)
Pt stated that she brushed her teeth at 1800 and did not desire to brush them again

## 2010-12-01 NOTE — Progress Notes (Signed)
Patient is a 35 year old G3 P0111 now 27 4/7 here with cervical incompetence and short cervix. She has a cerclage in place. She has no complaints. Denies ROM. Reports Good FM.  AF VSS  Irritability on the toco this am  Uterus is non tender   IMP: IUP at 27 4/7 Cervical Incompetence  Plan: Continue bedrest, weekly progesterone Ultrasound at 28 weeks Repeat steroids at 28 weeks

## 2010-12-02 NOTE — Progress Notes (Signed)
UR Chart review completed.  

## 2010-12-02 NOTE — Progress Notes (Signed)
No complaints.  Good FM.  No ctx, vb, or lof.  AF, VSS  +FHT Abd:  Gravid, NT Ext:  NT  A/P:  27 5/7 weeks w/ incompetent cvx and shortened cervix Cerclage Continue bedrest [place] at 28wks progesterone

## 2010-12-03 NOTE — Progress Notes (Signed)
Pt without complaints. GFM rare ctxs VSSAF FHR + Ctxs occas  Abd  Gravid nt LE  Bil neg homans  IUP @ 27 6/7 [place] and Repeat BMZ at 28 weeks DL

## 2010-12-04 MED ORDER — SODIUM CHLORIDE 0.9 % IJ SOLN
10.0000 mL | Freq: Two times a day (BID) | INTRAMUSCULAR | Status: DC
  Filled 2010-12-04 (×2): qty 10

## 2010-12-04 MED ORDER — BETAMETHASONE SOD PHOS & ACET 6 (3-3) MG/ML IJ SUSP
12.5000 mg | Freq: Once | INTRAMUSCULAR | Status: AC
  Administered 2010-12-04: 12.5 mg via INTRAMUSCULAR
  Filled 2010-12-04: qty 2.1

## 2010-12-04 NOTE — Progress Notes (Signed)
Stable today w/o complaints.  FHR 150's.   For [place] today and BMZ boost X 1. EGA 28 0/7

## 2010-12-05 ENCOUNTER — Inpatient Hospital Stay (HOSPITAL_COMMUNITY): Payer: 59

## 2010-12-05 NOTE — Progress Notes (Signed)
UR Chart review completed.  

## 2010-12-05 NOTE — Progress Notes (Signed)
  IUP AT 28 1/7 WITH INCOMPETENT CERVIX AFEBRILE VSS GRAVID UTERUS NONTENDER NO CTX OR LEAKAGE FHR 140-150 SOME ACCELS NO DECEL LAST PM CONMTINUE BEDREST SONO TODAY

## 2010-12-06 MED ORDER — FAMOTIDINE 20 MG PO TABS
20.0000 mg | ORAL_TABLET | Freq: Two times a day (BID) | ORAL | Status: DC
  Administered 2010-12-06 – 2011-01-13 (×76): 20 mg via ORAL
  Filled 2010-12-06 (×78): qty 1

## 2010-12-06 NOTE — Progress Notes (Signed)
  S:  GOOD FM. NO CTX'S  NO LEAKAGE O:  AFEBRILE VSS       GRAVID UTERUS NT       FHR 140'S SOME ACCELS NO DECELS       SONOI CX LENGTH 0.7 NORMAL AF VOLUME AND EFW A:  IUP AT 28.2 INCOMPETENT CX P:  CONTINUE BEDREST

## 2010-12-07 NOTE — Progress Notes (Signed)
  S:  GOOD FM  NO CTX'S OR LEAKAGE O: AF VSS      GRAVID UTERUS NONTENDER       FHR 150'S NO DECELS SOME ACCELS A:  IUP AT 28.3 INCOMPETENT CX P: BEDREST

## 2010-12-08 NOTE — Progress Notes (Signed)
UR chart review completed.  

## 2010-12-08 NOTE — Progress Notes (Signed)
Pt without complaints  VSSAF FHR + Ctx Rare Abd:  Gravid nt  IUP @ 28 4/7 Cx shorter; stitch in place Continue present care

## 2010-12-09 ENCOUNTER — Ambulatory Visit (HOSPITAL_COMMUNITY): Payer: 59

## 2010-12-09 NOTE — Progress Notes (Signed)
Patient without complaints. AF VSS Good FM Uterus is soft and Non tender  IMP Cervical incompetence Cerclage Short cervix  Plan Continue current plan

## 2010-12-10 NOTE — Progress Notes (Signed)
No complaints. + FM  AF, VSS  + FHT Gen - NAD Abd - NT, gravid  A/P:  Short/incompetent cervix Continue bedrest & progesterone S/p BMZ x 2 (24 & 28wks)

## 2010-12-11 NOTE — Progress Notes (Signed)
  S:  NO CTX , LEAKAGE OR BLEEDING O:  AF VSS       GRAVID UTERUS NON TENDER       FHR TRACING LAST PM 150"S WITH ACCELS NO CTX A:  IUP AT 29 WEEKS WITH INCOMPETENT CX P:  BEDREST

## 2010-12-12 NOTE — Progress Notes (Signed)
Now 29 1/7 with no new complaints, FHR stable, will cont BR + obsv

## 2010-12-13 NOTE — Progress Notes (Signed)
29 2/7 today, good FM, no new complaints.  Stable FHR.

## 2010-12-14 NOTE — Progress Notes (Signed)
  29 3/7 today.  No new complaints, good FM.  Cont. BR + OBSV

## 2010-12-15 NOTE — Progress Notes (Signed)
29 4/7 weeks  No C/O, No leaking/bleeding  Ut soft/NT  FHT +, good FM  A/P Cont present management

## 2010-12-16 NOTE — Progress Notes (Signed)
  S:  NO COMPLAINTS. NO LEAKAGE OR BLEEDING O: AF VSS       GRAVID UTERUS  NONTENDER       FHR 150'S WITH ACCELS A:  IUP AT 29 5/7 WITH INCOMPETENT CERVIX P: CONTINUE BEDREST

## 2010-12-16 NOTE — Progress Notes (Signed)
UR Chart review completed.  

## 2010-12-17 NOTE — Progress Notes (Signed)
  Pt without complaints.  GFM, no ctxs  VSSAF FHR + Ctx occasional  Abd: Gravid nt  IUP at 29 6/7 Inc Cx- stable continue bedrest DL

## 2010-12-18 ENCOUNTER — Inpatient Hospital Stay (HOSPITAL_COMMUNITY): Payer: 59

## 2010-12-18 ENCOUNTER — Encounter (HOSPITAL_COMMUNITY): Payer: Self-pay | Admitting: Obstetrics and Gynecology

## 2010-12-18 NOTE — Progress Notes (Signed)
Patient is stable and doing well. Reports Good Fetal Movement. Denies UCs, Denies SROM  AF VSS Uterus is nontender FHTones are reassurring  Impression IUP at 30 weeks Cervical incompetence Cervical Cerclage  Plan Continue current plan with bedrest and progesterone

## 2010-12-19 NOTE — Progress Notes (Signed)
No complaints,  Denies VB, LOF, ctx.    AF, VSS + FHT Gen:  NAD Abd:  Uterus NT  A/P:  30+1 wks w/ cervical incompetence.  Cerclage Continue bedrest & progesterone

## 2010-12-20 NOTE — Progress Notes (Signed)
No complaints.  Mild incrase BP yesterday, reports it was b/c she was "mad at her family"  No HA, n/v, RUQ pain.  Good FM  AF, VSS  BP 120-140/80-90s  +FHT Gen - NAD Abd - gravid, NT Ext - NT  A/p:  Continue current care.  Watch BP today

## 2010-12-21 NOTE — Progress Notes (Signed)
No c/o.  Good FM  AF, VSS Gen - NAD Abd - gravid, NT Ext - NT  A/P:  Continue current plan.

## 2010-12-22 NOTE — Progress Notes (Signed)
30  4/7 weeks.  No new c/o, FHR stable.

## 2010-12-22 NOTE — Progress Notes (Signed)
UR chart review completed.  

## 2010-12-23 MED ORDER — CLOTRIMAZOLE 2 % VA CREA
1.0000 | TOPICAL_CREAM | Freq: Every day | VAGINAL | Status: AC
  Administered 2010-12-23 – 2010-12-25 (×3): 1 via VAGINAL
  Filled 2010-12-23: qty 22.2

## 2010-12-23 NOTE — Progress Notes (Signed)
30 5/7 weeks C/O occassional vaginal discharge, no gushes, no blood, no UCs VSS Afeb Ut soft and NT FHT + NTZ neg White thin discharge at vaginal introitus  A/P: Continue BR         Vaginal antifungal cream

## 2010-12-24 NOTE — Progress Notes (Signed)
  S: NO COMPLAINTS O: AFEBRILE VSS      GRAVID UTERUS  NO LEAKAGE       MONITOR LAST PM REACTIVE NO CTX A:  IUP AT 30 6/7 INCOMPETENT CX P:  CONTINUE BED REST

## 2010-12-25 MED ORDER — TERBUTALINE SULFATE 1 MG/ML IJ SOLN
0.2500 mg | Freq: Once | INTRAMUSCULAR | Status: AC
  Administered 2010-12-25: 0.25 mg via SUBCUTANEOUS

## 2010-12-25 MED ORDER — TERBUTALINE SULFATE 1 MG/ML IJ SOLN
INTRAMUSCULAR | Status: AC
  Administered 2010-12-25: 22:00:00
  Filled 2010-12-25: qty 1

## 2010-12-25 NOTE — Progress Notes (Signed)
No complaints.  Good FM AF, VSS Gen - NAD Abd - gravid, NT  A/P:  31 wks w/ cvx incomp.   Continue current plan.

## 2010-12-25 NOTE — Progress Notes (Signed)
UR Chart review completed.  

## 2010-12-26 NOTE — Progress Notes (Signed)
Patient without complaints Good FM  AV VSS FHR Reassurring Toco No UCS  Abd uterus is soft  IMP IUP at 31 1/7 Cervical incompetence Cerclage  Plan Continue current in hospital plan

## 2010-12-27 NOTE — Progress Notes (Signed)
Patient is resting Reports Good FM, Denies UCs or VB  AF VSS Uterus is non tender  IMP IUP at 31/2/7 Cervical incompetence Cerclage  PLAN Continue current in hospital plan

## 2010-12-28 NOTE — Progress Notes (Signed)
Patient is without any complaints Good fetal movement. Denies contractions or vaginal discharge or bleeding.  Afebrile  Vital signs stable  Uterus is soft and nontender  IMPRESSION IUP at 31 w 3 d Cervical incompetence Cerclage  PLAN Patient is very stable Continue current in hospital plan

## 2010-12-29 MED ORDER — AZITHROMYCIN 250 MG PO TABS
250.0000 mg | ORAL_TABLET | Freq: Every day | ORAL | Status: AC
  Administered 2010-12-30 – 2011-01-02 (×4): 250 mg via ORAL
  Filled 2010-12-29 (×4): qty 1

## 2010-12-29 MED ORDER — AZITHROMYCIN 250 MG PO TABS
500.0000 mg | ORAL_TABLET | Freq: Every day | ORAL | Status: AC
  Administered 2010-12-29: 500 mg via ORAL
  Filled 2010-12-29: qty 2

## 2010-12-29 NOTE — Progress Notes (Signed)
Pt c/o sinus congestion, feels like inf'n.  No ctx, vb or lof.  Good fm.  Af, VSS + FHT Abd - gravid, nt  A/P:  Bedrest, progesterone Zpack

## 2010-12-29 NOTE — Progress Notes (Signed)
UR chart review completed.  

## 2010-12-30 NOTE — Progress Notes (Signed)
UR Chart review completed.  

## 2010-12-30 NOTE — Progress Notes (Signed)
S:  IUP AT 31 5/7 NO CTX OR LEAKAGE O:  AFEBRILE VSS        GRAVID UTERUS NONTENEDER         FHR LAST PM 140"S NO CTX'S REACTIVE NO DECELS A:  IUP AT 31 5/7 WITH ONCOMPETENT CX P:  CONTINUIE BEDREST

## 2010-12-31 NOTE — Progress Notes (Signed)
Pt without complaints.  GFM.  Occas Ctxs VSSAF FHR reactive 140s Ctx mild, occas  Abd Gravid Nt  IUP at 31 6/7 Inc Cx-cerlcage PTL Prev CS for Repeat Continue present care DL

## 2011-01-01 ENCOUNTER — Inpatient Hospital Stay (HOSPITAL_COMMUNITY): Payer: 59

## 2011-01-01 NOTE — Progress Notes (Signed)
32 0/7 weeks today, stable FHR, no new complaints.

## 2011-01-01 NOTE — Consult Note (Signed)
MATERNAL FETAL MEDICINE CONSULT  Patient Name: Melinda Sanders Medical Record Number:  [id number] Date of Birth: Jun 05, 1975 Requesting Physician Name:  Meriel Pica Date of Service: 01/01/2011  Chief Complaint Cervical insufficiency and previous cesarean delivery  History of Present Illness Melinda Sanders was seen today for prenatal diagnosis secondary to cervical innsuffciency at the request of Dr. Erlinda Hong Comb .  The patient is a 35 y.o. [id number], with an EDD of 02/26/2011, Alternate EDD Entry dating method.    Review of Systems A comprehensive review of systems was negative.  Patient History OB History    Grav Para Term Preterm Abortions TAB SAB Ect Mult Living   3 1 0 1 1 0 1 0 0 1      # Outc Date GA Lbr Len/2nd Wgt Sex Del Anes PTL Lv   1 PRE            2 SAB            3 CUR               Past Medical History  Diagnosis Date  . No pertinent past medical history     Past Surgical History  Procedure Date  . Cervical cerclage August 06, 2010  . Cervical cerclage January 13, 2003  . Cesarean section May 26, 2003  . Dilation and curettage of uterus June 15, 2002     Physical Examination Filed Vitals:   01/01/11 0731  BP: 105/53  Pulse: 109  Temp: 98.2 F (36.8 C)  Resp: 16   Ultrasound imaging of the cervix reveals continued closed cervical length of 0.7-0.9 cm with cerclage in place, cephalic presentation, normal amniotic fluid, normal interval growth.  Assessment and Recommendations 1. Patient has achieved a gestation in which she has completed reasonable corticosteroid and magnesium neuro prophylaxis. 2. Further cervix assessment is best obtained by clinical exam as normal cervical effacement and shortening are expected after [redacted] weeks gestation.  3. Consider either physical therapy consult or gradual increase of patient physical activity to minimized risks of prolonged bed rest.  4. Consideration may be given to outpatient management from 32-34 weeks, with  patient's home within 20 miles of hospital. 5. If continued healthy responses in pregnancy, consider delivery no sooner than 37 weeks with approach to expectant management up to 39 weeks. 6. Growth ultrasound in 3-4 weeks if considered helpful for clinical decision making.  I spent 15 minutes with [name] today of which 100% was face-to-face counseling. Thank you for the opportunity to work with [name].  Krystalle Pilkington,JOE

## 2011-01-01 NOTE — Progress Notes (Signed)
Report in [facility]EP[facility]; follow-up as needed 

## 2011-01-02 MED ORDER — INFLUENZA VIRUS VACC SPLIT PF IM SUSP
0.5000 mL | Freq: Once | INTRAMUSCULAR | Status: AC
  Administered 2011-01-03: 0.5 mL via INTRAMUSCULAR
  Filled 2011-01-02 (×2): qty 0.5

## 2011-01-02 NOTE — Progress Notes (Signed)
32 1/7 weeks No c/o VSS afeb FHT +  A/P: up to BR and shower qd         Up for limited time daily to assess strength and simulate home activity (Pt lives more than 30 minutes away) Consider D/C in 1-2 weeks        D/W pt plan for delivery-repeat C/S with BTL. D/W pt BTL with premature delivery.  States she is positive she wants BTL.

## 2011-01-02 NOTE — Progress Notes (Signed)
UR Chart review completed.  

## 2011-01-03 NOTE — Progress Notes (Signed)
32 2/7weeks No C/O VSS Afeb FHT + Stable Cont present plan

## 2011-01-04 NOTE — Progress Notes (Signed)
32 3/7weeks VSS Afeb FM+ FHT + Ut NT  A/P: Cont present management

## 2011-01-05 MED ORDER — OXYCODONE-ACETAMINOPHEN 5-325 MG PO TABS
2.0000 | ORAL_TABLET | Freq: Once | ORAL | Status: AC
  Administered 2011-01-05: 2 via ORAL
  Filled 2011-01-05: qty 2

## 2011-01-05 NOTE — Progress Notes (Signed)
Patient is resting comfortably. Good FM. Denies UCS or ROM.  AF VSS FHR is reactive Toco no UCS  Uterus is soft and non tender  IMP IUP at 32 4/7 Cervical Incompetence Prev C/Section Cerclage  Plan Continue current plan Plan Repeat C/Section and BTL for delivery

## 2011-01-06 MED ORDER — TERBUTALINE SULFATE 1 MG/ML IJ SOLN
INTRAMUSCULAR | Status: AC
  Administered 2011-01-06: 0.25 mg
  Filled 2011-01-06: qty 1

## 2011-01-06 MED ORDER — TERBUTALINE SULFATE 1 MG/ML IJ SOLN
0.2500 mg | Freq: Once | INTRAMUSCULAR | Status: AC
  Administered 2011-01-06: 0.25 mg via SUBCUTANEOUS

## 2011-01-06 NOTE — Progress Notes (Signed)
UR Chart review completed.  

## 2011-01-06 NOTE — Progress Notes (Addendum)
Pt without complaints GFM  Rare Ctxs  VSSAF  FHR Reactive Ctxs occasional mild  Abd Gravid Nt  IUP at 32 5/7 Inc Cx Plan per Dr Henderson Cloud and Botti from 01/02/11   Increase activity slowly.  Consider outpt management in next 1-2- weeks

## 2011-01-07 NOTE — Progress Notes (Signed)
No c/o.  + FM  Af, vss Gen - NAD Abd - gravid, NT  A/P:  Continue current plan.

## 2011-01-08 NOTE — Progress Notes (Signed)
33 0/7weeks  No C/O  VSS  Afeb Ut soft NT  FHT reactive UCs irreg / mild  Cx-Stitch intact/closed/thin/0 station  A/P: Current management until 34 weeks

## 2011-01-09 MED ORDER — HYDROXYPROGESTERONE CAPROATE 250 MG/ML IM OIL: 250.0000 mg | TOPICAL_OIL | INTRAMUSCULAR | Status: DC

## 2011-01-09 MED ORDER — HYDROXYPROGESTERONE CAPROATE 250 MG/ML IM OIL
250.0000 mg | TOPICAL_OIL | INTRAMUSCULAR | Status: DC
  Administered 2011-01-09: 250 mg via INTRAMUSCULAR
  Filled 2011-01-09: qty 1

## 2011-01-09 NOTE — Progress Notes (Signed)
UR Chart review completed.  

## 2011-01-09 NOTE — Progress Notes (Signed)
  S:  GOOD FETAL MOVEMENT NO CTX O:  AFEBRILE VSS       GRAVID UTERUS NONTENDER        FHR REACTIVE NO DECEL OR CTX A:  IUP AT 33 WEEK WITH INCOMPETENT CX P:  BEDREST

## 2011-01-10 MED ORDER — TERBUTALINE SULFATE 1 MG/ML IJ SOLN
INTRAMUSCULAR | Status: AC
  Administered 2011-01-10: 0.25 mg via SUBCUTANEOUS
  Filled 2011-01-10: qty 1

## 2011-01-10 MED ORDER — TERBUTALINE SULFATE 1 MG/ML IJ SOLN
0.2500 mg | Freq: Once | INTRAMUSCULAR | Status: AC
  Administered 2011-01-10: 0.25 mg via SUBCUTANEOUS

## 2011-01-10 MED ORDER — NIFEDIPINE 10 MG PO CAPS
10.0000 mg | ORAL_CAPSULE | Freq: Three times a day (TID) | ORAL | Status: DC
  Administered 2011-01-11: 10 mg via ORAL
  Filled 2011-01-10: qty 1

## 2011-01-10 MED ORDER — TERBUTALINE SULFATE 1 MG/ML IJ SOLN
0.2500 mg | Freq: Once | INTRAMUSCULAR | Status: AC
  Administered 2011-01-10: 0.25 mg via SUBCUTANEOUS
  Filled 2011-01-10: qty 1

## 2011-01-10 MED ORDER — NIFEDIPINE 10 MG PO CAPS
ORAL_CAPSULE | ORAL | Status: AC
  Administered 2011-01-10: 10 mg via ORAL
  Filled 2011-01-10: qty 1

## 2011-01-10 NOTE — Progress Notes (Signed)
S:  C/O CTX'S O:  AF VSS       GRAVID UTERUS NONTENDER      MONITOR  CTX'S Q 5 MIN.  MILD FHR REACTIVE NO DECELS A  IUP AT 33 2/7 INCOMPETENT CX     INCREASING UTERINE ACTIVITY P:  SQ TERBUTALINE

## 2011-01-10 NOTE — Progress Notes (Signed)
RN called to pt's bedside, "I'm hurting in my lower abd and lower back".  Pt. Placed on EFM & Toco. Pain 8/10 with ctx.

## 2011-01-11 MED ORDER — LACTATED RINGERS IV SOLN
INTRAVENOUS | Status: DC
  Administered 2011-01-11 – 2011-01-13 (×6): via INTRAVENOUS

## 2011-01-11 MED ORDER — MAGNESIUM SULFATE BOLUS VIA INFUSION
4.0000 g | Freq: Once | INTRAVENOUS | Status: AC
  Administered 2011-01-11: 4 g via INTRAVENOUS
  Filled 2011-01-11: qty 500

## 2011-01-11 MED ORDER — BUTORPHANOL TARTRATE 2 MG/ML IJ SOLN
1.0000 mg | Freq: Once | INTRAMUSCULAR | Status: AC
  Administered 2011-01-11: 1 mg via INTRAVENOUS

## 2011-01-11 MED ORDER — SALINE SPRAY 0.65 % NA SOLN
1.0000 | NASAL | Status: DC | PRN
  Administered 2011-01-11: 1 via NASAL
  Filled 2011-01-11: qty 44

## 2011-01-11 MED ORDER — MAGNESIUM SULFATE 40 G IN LACTATED RINGERS - SIMPLE
2.0000 g/h | INTRAVENOUS | Status: DC
  Administered 2011-01-12 – 2011-01-13 (×3): 2 g/h via INTRAVENOUS
  Filled 2011-01-11 (×4): qty 500

## 2011-01-11 MED ORDER — TERBUTALINE SULFATE 1 MG/ML IJ SOLN
0.2500 mg | Freq: Once | INTRAMUSCULAR | Status: AC
  Administered 2011-01-11: 0.25 mg via SUBCUTANEOUS
  Filled 2011-01-11: qty 1

## 2011-01-11 MED ORDER — BUTORPHANOL TARTRATE 2 MG/ML IJ SOLN
1.0000 mg | Freq: Once | INTRAMUSCULAR | Status: AC
  Administered 2011-01-11: 1 mg via INTRAVENOUS
  Filled 2011-01-11: qty 1

## 2011-01-11 NOTE — Progress Notes (Signed)
S:  C/O INCREASING CTX"S O: AF VSS      CTX'S NOTED Q 3-5 MIN      FHR REACTIVE W/O DECELS      CX CLOSED CERCLAGE INTACT VTX -1 80 % A: IUP AT 33 3/7 INCOMPETENT CX  INCREASING UTERINE ACTIVITY DESPITE PROCARDIA P:  D/C PROCARDIA START MAGNESIUM SULFATE RISKS DISSCUSSED

## 2011-01-11 NOTE — Progress Notes (Signed)
On 2 gm or mag sulfate after sq terb ctx ceased jm

## 2011-01-11 NOTE — Progress Notes (Signed)
Status report given to Dr. Arelia Sneddon. Request TID monitoring for patient.  Dr. Arelia Sneddon in agreement.

## 2011-01-11 NOTE — Progress Notes (Signed)
Pt. Off monitor - up to BR for void and ADLs.  RN currently waiting on Mag. Sulfate to arrive on unit. Pharmacy called.

## 2011-01-11 NOTE — Progress Notes (Signed)
Dr. Arelia Sneddon notified of ctx q 5 min, pt.'s pain 8/10 with ctx. Telephone orders received.

## 2011-01-12 ENCOUNTER — Other Ambulatory Visit (HOSPITAL_COMMUNITY): Payer: 59

## 2011-01-12 ENCOUNTER — Inpatient Hospital Stay (HOSPITAL_COMMUNITY): Payer: 59

## 2011-01-12 LAB — MAGNESIUM: Magnesium: 4.5 mg/dL — ABNORMAL HIGH (ref 1.5–2.5)

## 2011-01-12 MED ORDER — TERBUTALINE SULFATE 1 MG/ML IJ SOLN
0.2500 mg | Freq: Once | INTRAMUSCULAR | Status: AC
  Administered 2011-01-12: 0.25 mg via SUBCUTANEOUS
  Filled 2011-01-12: qty 1

## 2011-01-12 MED ORDER — TERBUTALINE SULFATE 1 MG/ML IJ SOLN
INTRAMUSCULAR | Status: AC
  Filled 2011-01-12: qty 1

## 2011-01-12 MED ORDER — TERBUTALINE SULFATE 1 MG/ML IJ SOLN
0.2500 mg | Freq: Once | INTRAMUSCULAR | Status: AC
  Administered 2011-01-12: 0.25 mg via SUBCUTANEOUS

## 2011-01-12 NOTE — Progress Notes (Signed)
Report in [facility]EP[facility]; follow-up as needed 

## 2011-01-12 NOTE — Progress Notes (Signed)
UR chart review completed.  

## 2011-01-12 NOTE — Progress Notes (Signed)
Patient is currently on 2 gram of Mg and reporting UCs Every 10 minutes. FHR is reactive Cervical exam 90%/tight fingertip/vertex Stitch is tight  PTL at 33 w 4 days Increase Mg to 2.5 gram Give terbutaline now C Section if she breaks through

## 2011-01-13 ENCOUNTER — Encounter (HOSPITAL_COMMUNITY)
Admission: AD | Disposition: A | Discharge status: Home / Self Care | Payer: Self-pay | Source: Home / Self Care | Attending: Obstetrics and Gynecology

## 2011-01-13 ENCOUNTER — Encounter (HOSPITAL_COMMUNITY): Payer: Self-pay | Admitting: Anesthesiology

## 2011-01-13 ENCOUNTER — Other Ambulatory Visit: Payer: Self-pay | Admitting: Obstetrics and Gynecology

## 2011-01-13 ENCOUNTER — Encounter (HOSPITAL_COMMUNITY): Payer: Self-pay | Admitting: *Deleted

## 2011-01-13 ENCOUNTER — Inpatient Hospital Stay (HOSPITAL_COMMUNITY): Payer: 59 | Admitting: Anesthesiology

## 2011-01-13 LAB — CBC
Hemoglobin: 12.1 g/dL (ref 12.0–15.0)
MCHC: 34.4 g/dL (ref 30.0–36.0)
Platelets: 168 10*3/uL (ref 150–400)

## 2011-01-13 SURGERY — Surgical Case
Anesthesia: Regional | Site: Abdomen | Wound class: Clean Contaminated

## 2011-01-13 MED ORDER — PHENYLEPHRINE HCL 10 MG/ML IJ SOLN
INTRAMUSCULAR | Status: DC | PRN
  Administered 2011-01-13: 120 ug via INTRAVENOUS
  Administered 2011-01-13 (×2): 80 ug via INTRAVENOUS

## 2011-01-13 MED ORDER — ONDANSETRON HCL 4 MG/2ML IJ SOLN
INTRAMUSCULAR | Status: DC | PRN
  Administered 2011-01-13: 4 mg via INTRAVENOUS

## 2011-01-13 MED ORDER — PHENYLEPHRINE 40 MCG/ML (10ML) SYRINGE FOR IV PUSH (FOR BLOOD PRESSURE SUPPORT)
PREFILLED_SYRINGE | INTRAVENOUS | Status: AC
  Filled 2011-01-13: qty 5

## 2011-01-13 MED ORDER — IBUPROFEN 600 MG PO TABS
600.0000 mg | ORAL_TABLET | Freq: Four times a day (QID) | ORAL | Status: DC
  Administered 2011-01-14 – 2011-01-16 (×9): 600 mg via ORAL
  Filled 2011-01-13 (×6): qty 1

## 2011-01-13 MED ORDER — DOCUSATE SODIUM 100 MG PO CAPS
100.0000 mg | ORAL_CAPSULE | Freq: Every day | ORAL | Status: DC
  Administered 2011-01-14 – 2011-01-16 (×3): 100 mg via ORAL
  Filled 2011-01-13 (×3): qty 1

## 2011-01-13 MED ORDER — OXYTOCIN 10 UNIT/ML IJ SOLN
INTRAMUSCULAR | Status: AC
  Filled 2011-01-13: qty 1

## 2011-01-13 MED ORDER — KETOROLAC TROMETHAMINE 60 MG/2ML IM SOLN
60.0000 mg | Freq: Once | INTRAMUSCULAR | Status: AC | PRN
  Administered 2011-01-13: 60 mg via INTRAMUSCULAR

## 2011-01-13 MED ORDER — DEXTROSE IN LACTATED RINGERS 5 % IV SOLN
INTRAVENOUS | Status: DC
  Administered 2011-01-13 – 2011-01-14 (×2): via INTRAVENOUS

## 2011-01-13 MED ORDER — NALBUPHINE HCL 10 MG/ML IJ SOLN: 5.0000 mg | INTRAMUSCULAR | Status: DC | PRN

## 2011-01-13 MED ORDER — OXYTOCIN 20 UNITS IN LACTATED RINGERS INFUSION - SIMPLE
INTRAVENOUS | Status: DC | PRN
  Administered 2011-01-13: 20 [IU] via INTRAVENOUS

## 2011-01-13 MED ORDER — MEPERIDINE HCL 25 MG/ML IJ SOLN: 6.2500 mg | INTRAMUSCULAR | Status: DC | PRN

## 2011-01-13 MED ORDER — ONDANSETRON HCL 4 MG/2ML IJ SOLN: 4.0000 mg | INTRAMUSCULAR | Status: DC | PRN

## 2011-01-13 MED ORDER — NALOXONE HCL 0.4 MG/ML IJ SOLN: 0.4000 mg | INTRAMUSCULAR | Status: DC | PRN

## 2011-01-13 MED ORDER — DIBUCAINE 1 % RE OINT: 1.0000 "application " | TOPICAL_OINTMENT | RECTAL | Status: DC | PRN

## 2011-01-13 MED ORDER — DIPHENHYDRAMINE HCL 50 MG/ML IJ SOLN: 25.0000 mg | INTRAMUSCULAR | Status: DC | PRN

## 2011-01-13 MED ORDER — TERBUTALINE SULFATE 1 MG/ML IJ SOLN
0.2500 mg | Freq: Once | INTRAMUSCULAR | Status: AC
  Administered 2011-01-13: 0.25 mg via SUBCUTANEOUS
  Filled 2011-01-13: qty 1

## 2011-01-13 MED ORDER — SCOPOLAMINE 1 MG/3DAYS TD PT72
1.0000 | MEDICATED_PATCH | Freq: Once | TRANSDERMAL | Status: AC
  Administered 2011-01-13: 1.5 mg via TRANSDERMAL

## 2011-01-13 MED ORDER — CEFAZOLIN SODIUM 1-5 GM-% IV SOLN
1.0000 g | Freq: Once | INTRAVENOUS | Status: DC
  Filled 2011-01-13: qty 50

## 2011-01-13 MED ORDER — HYDROMORPHONE HCL 1 MG/ML IJ SOLN
INTRAMUSCULAR | Status: AC
  Administered 2011-01-13: 0.5 mg via INTRAVENOUS
  Filled 2011-01-13: qty 1

## 2011-01-13 MED ORDER — IBUPROFEN 600 MG PO TABS
600.0000 mg | ORAL_TABLET | Freq: Four times a day (QID) | ORAL | Status: DC | PRN
  Filled 2011-01-13 (×3): qty 1

## 2011-01-13 MED ORDER — ONDANSETRON HCL 4 MG/2ML IJ SOLN: 4.0000 mg | Freq: Three times a day (TID) | INTRAMUSCULAR | Status: DC | PRN

## 2011-01-13 MED ORDER — ONDANSETRON HCL 4 MG PO TABS: 4.0000 mg | ORAL_TABLET | ORAL | Status: DC | PRN

## 2011-01-13 MED ORDER — SCOPOLAMINE 1 MG/3DAYS TD PT72
1.0000 | MEDICATED_PATCH | Freq: Once | TRANSDERMAL | Status: DC
  Filled 2011-01-13: qty 1

## 2011-01-13 MED ORDER — DIPHENHYDRAMINE HCL 25 MG PO CAPS: 25.0000 mg | ORAL_CAPSULE | Freq: Four times a day (QID) | ORAL | Status: DC | PRN

## 2011-01-13 MED ORDER — SCOPOLAMINE 1 MG/3DAYS TD PT72
MEDICATED_PATCH | TRANSDERMAL | Status: AC
  Administered 2011-01-13: 1.5 mg via TRANSDERMAL
  Filled 2011-01-13: qty 1

## 2011-01-13 MED ORDER — SENNOSIDES-DOCUSATE SODIUM 8.6-50 MG PO TABS
2.0000 | ORAL_TABLET | Freq: Every day | ORAL | Status: DC
  Administered 2011-01-13 – 2011-01-15 (×3): 2 via ORAL

## 2011-01-13 MED ORDER — MEDROXYPROGESTERONE ACETATE 150 MG/ML IM SUSP: 150.0000 mg | INTRAMUSCULAR | Status: DC | PRN

## 2011-01-13 MED ORDER — NALBUPHINE HCL 10 MG/ML IJ SOLN
5.0000 mg | INTRAMUSCULAR | Status: DC | PRN
  Filled 2011-01-13: qty 1

## 2011-01-13 MED ORDER — DIPHENHYDRAMINE HCL 50 MG/ML IJ SOLN: 12.5000 mg | INTRAMUSCULAR | Status: DC | PRN

## 2011-01-13 MED ORDER — FENTANYL CITRATE 0.05 MG/ML IJ SOLN
INTRAMUSCULAR | Status: AC
  Filled 2011-01-13: qty 2

## 2011-01-13 MED ORDER — SIMETHICONE 80 MG PO CHEW: 80.0000 mg | CHEWABLE_TABLET | ORAL | Status: DC | PRN

## 2011-01-13 MED ORDER — TETANUS-DIPHTH-ACELL PERTUSSIS 5-2.5-18.5 LF-MCG/0.5 IM SUSP
0.5000 mL | Freq: Once | INTRAMUSCULAR | Status: AC
  Administered 2011-01-14: 0.5 mL via INTRAMUSCULAR
  Filled 2011-01-13: qty 0.5

## 2011-01-13 MED ORDER — MORPHINE SULFATE 0.5 MG/ML IJ SOLN
INTRAMUSCULAR | Status: AC
  Filled 2011-01-13: qty 10

## 2011-01-13 MED ORDER — CEFAZOLIN SODIUM 1-5 GM-% IV SOLN
INTRAVENOUS | Status: DC | PRN
  Administered 2011-01-13: 1 g via INTRAVENOUS

## 2011-01-13 MED ORDER — LANOLIN HYDROUS EX OINT: 1.0000 "application " | TOPICAL_OINTMENT | CUTANEOUS | Status: DC | PRN

## 2011-01-13 MED ORDER — MENTHOL 3 MG MT LOZG: 1.0000 | LOZENGE | OROMUCOSAL | Status: DC | PRN

## 2011-01-13 MED ORDER — SIMETHICONE 80 MG PO CHEW
80.0000 mg | CHEWABLE_TABLET | Freq: Three times a day (TID) | ORAL | Status: DC
  Administered 2011-01-13 – 2011-01-16 (×9): 80 mg via ORAL

## 2011-01-13 MED ORDER — OXYTOCIN 20 UNITS IN LACTATED RINGERS INFUSION - SIMPLE
INTRAVENOUS | Status: AC
  Administered 2011-01-13: 125 mL/h via INTRAVENOUS
  Filled 2011-01-13: qty 1000

## 2011-01-13 MED ORDER — MEASLES, MUMPS & RUBELLA VAC ~~LOC~~ INJ
0.5000 mL | INJECTION | Freq: Once | SUBCUTANEOUS | Status: AC
  Administered 2011-01-14: 0.5 mL via SUBCUTANEOUS
  Filled 2011-01-13 (×2): qty 0.5

## 2011-01-13 MED ORDER — ONDANSETRON HCL 4 MG/2ML IJ SOLN
INTRAMUSCULAR | Status: AC
  Filled 2011-01-13: qty 2

## 2011-01-13 MED ORDER — DIPHENHYDRAMINE HCL 50 MG/ML IJ SOLN
INTRAMUSCULAR | Status: DC | PRN
  Administered 2011-01-13 (×2): 12.5 mg via INTRAVENOUS

## 2011-01-13 MED ORDER — KETOROLAC TROMETHAMINE 30 MG/ML IJ SOLN
30.0000 mg | Freq: Four times a day (QID) | INTRAMUSCULAR | Status: AC | PRN
  Administered 2011-01-13 – 2011-01-14 (×2): 30 mg via INTRAVENOUS
  Filled 2011-01-13 (×2): qty 1

## 2011-01-13 MED ORDER — HYDROMORPHONE HCL 1 MG/ML IJ SOLN
0.2500 mg | INTRAMUSCULAR | Status: DC | PRN
  Administered 2011-01-13 (×2): 0.5 mg via INTRAVENOUS

## 2011-01-13 MED ORDER — NALBUPHINE HCL 10 MG/ML IJ SOLN
5.0000 mg | INTRAMUSCULAR | Status: DC | PRN
  Administered 2011-01-13: 10 mg via SUBCUTANEOUS
  Filled 2011-01-13: qty 1

## 2011-01-13 MED ORDER — CITRIC ACID-SODIUM CITRATE 334-500 MG/5ML PO SOLN
ORAL | Status: AC
  Administered 2011-01-13: 30 mL
  Filled 2011-01-13: qty 15

## 2011-01-13 MED ORDER — SODIUM CHLORIDE 0.9 % IJ SOLN: 3.0000 mL | INTRAMUSCULAR | Status: DC | PRN

## 2011-01-13 MED ORDER — MEPERIDINE HCL 25 MG/ML IJ SOLN
INTRAMUSCULAR | Status: AC
  Administered 2011-01-13: 6.25 mg via INTRAVENOUS
  Filled 2011-01-13: qty 1

## 2011-01-13 MED ORDER — OXYCODONE-ACETAMINOPHEN 5-325 MG PO TABS
1.0000 | ORAL_TABLET | ORAL | Status: DC | PRN
  Administered 2011-01-14 (×3): 1 via ORAL
  Filled 2011-01-13 (×3): qty 1

## 2011-01-13 MED ORDER — SODIUM CHLORIDE 0.9 % IV SOLN
1.0000 ug/kg/h | INTRAVENOUS | Status: DC | PRN
  Filled 2011-01-13: qty 2.5

## 2011-01-13 MED ORDER — KETOROLAC TROMETHAMINE 60 MG/2ML IM SOLN
INTRAMUSCULAR | Status: AC
  Administered 2011-01-13: 60 mg via INTRAMUSCULAR
  Filled 2011-01-13: qty 2

## 2011-01-13 MED ORDER — WITCH HAZEL-GLYCERIN EX PADS: 1.0000 "application " | MEDICATED_PAD | CUTANEOUS | Status: DC | PRN

## 2011-01-13 MED ORDER — OXYTOCIN 20 UNITS IN LACTATED RINGERS INFUSION - SIMPLE
125.0000 mL/h | INTRAVENOUS | Status: AC
  Administered 2011-01-13: 125 mL/h via INTRAVENOUS
  Filled 2011-01-13: qty 1000

## 2011-01-13 MED ORDER — METOCLOPRAMIDE HCL 5 MG/ML IJ SOLN: 10.0000 mg | Freq: Three times a day (TID) | INTRAMUSCULAR | Status: DC | PRN

## 2011-01-13 MED ORDER — KETOROLAC TROMETHAMINE 60 MG/2ML IM SOLN
60.0000 mg | Freq: Once | INTRAMUSCULAR | Status: AC | PRN
  Filled 2011-01-13: qty 2

## 2011-01-13 MED ORDER — PRENATAL PLUS 27-1 MG PO TABS
1.0000 | ORAL_TABLET | Freq: Every day | ORAL | Status: DC
  Administered 2011-01-14 – 2011-01-16 (×3): 1 via ORAL
  Filled 2011-01-13 (×3): qty 1

## 2011-01-13 MED ORDER — KETOROLAC TROMETHAMINE 30 MG/ML IJ SOLN: 30.0000 mg | Freq: Four times a day (QID) | INTRAMUSCULAR | Status: AC | PRN

## 2011-01-13 MED ORDER — DIPHENHYDRAMINE HCL 25 MG PO CAPS: 25.0000 mg | ORAL_CAPSULE | ORAL | Status: DC | PRN

## 2011-01-13 SURGICAL SUPPLY — 27 items
CHLORAPREP W/TINT 26ML (MISCELLANEOUS) ×2 IMPLANT
CLIP FILSHIE TUBAL LIGA STRL (Clip) ×1 IMPLANT
CLOTH BEACON ORANGE TIMEOUT ST (SAFETY) ×2 IMPLANT
DRSG COVADERM 4X6 (GAUZE/BANDAGES/DRESSINGS) ×1 IMPLANT
ELECT REM PT RETURN 9FT ADLT (ELECTROSURGICAL) ×2
ELECTRODE REM PT RTRN 9FT ADLT (ELECTROSURGICAL) ×1 IMPLANT
EXTRACTOR VACUUM M CUP 4 TUBE (SUCTIONS) IMPLANT
GLOVE BIO SURGEON STRL SZ 6.5 (GLOVE) ×2 IMPLANT
GLOVE BIOGEL PI IND STRL 7.0 (GLOVE) ×2 IMPLANT
GLOVE BIOGEL PI INDICATOR 7.0 (GLOVE) ×2
GOWN PREVENTION PLUS LG XLONG (DISPOSABLE) ×6 IMPLANT
KIT ABG SYR 3ML LUER SLIP (SYRINGE) ×2 IMPLANT
NDL HYPO 25X5/8 SAFETYGLIDE (NEEDLE) ×1 IMPLANT
NEEDLE HYPO 25X5/8 SAFETYGLIDE (NEEDLE) ×2 IMPLANT
NS IRRIG 1000ML POUR BTL (IV SOLUTION) ×2 IMPLANT
PACK C SECTION WH (CUSTOM PROCEDURE TRAY) ×2 IMPLANT
SLEEVE SCD COMPRESS KNEE MED (MISCELLANEOUS) IMPLANT
STAPLER VISISTAT 35W (STAPLE) IMPLANT
SUT CHROMIC 0 CT 802H (SUTURE) IMPLANT
SUT CHROMIC 0 CTX 36 (SUTURE) ×6 IMPLANT
SUT MNCRL AB 3-0 PS2 27 (SUTURE) ×1 IMPLANT
SUT MON AB-0 CT1 36 (SUTURE) ×2 IMPLANT
SUT PDS AB 0 CTX 60 (SUTURE) ×2 IMPLANT
SUT PLAIN 0 NONE (SUTURE) ×1 IMPLANT
TOWEL OR 17X24 6PK STRL BLUE (TOWEL DISPOSABLE) ×4 IMPLANT
TRAY FOLEY CATH 14FR (SET/KITS/TRAYS/PACK) ×1 IMPLANT
WATER STERILE IRR 1000ML POUR (IV SOLUTION) ×2 IMPLANT

## 2011-01-13 NOTE — Progress Notes (Signed)
Encounter addended by: Suella Grove on: 01/13/2011  5:39 PM<BR>     Documentation filed: Notes Section

## 2011-01-13 NOTE — Op Note (Signed)
Cesarean Section Procedure Note   Melinda Sanders  01/13/2011  Indications: Prior c-section, PTL, desires permanent sterilization   Pre-operative Diagnosis: preterm labor 33 weeks desires sterilization removal of cerclage.   Post-operative Diagnosis: Same   Procedure: repeat LTCD, right BTL with clip, left BPS, cerclage removal  Surgeon: Surgeon(s) and Role:    * Zelphia Cairo - Primary   Assistants: none  Anesthesia: spinal   Procedure Details:  The patient was seen in the Holding Room. The risks, benefits, complications, treatment options, and expected outcomes were discussed with the patient. The patient concurred with the proposed plan, giving informed consent. identified as Melinda Sanders and the procedure verified as C-Section Delivery. A Time Out was held and the above information confirmed.  After induction of anesthesia, the patient was draped and prepped in the usual sterile manner. A transverse was made and carried down through the subcutaneous tissue to the fascia. Fascial incision was made and extended transversely. The fascia was separated from the underlying rectus tissue superiorly and inferiorly. The peritoneum was identified and entered. Peritoneal incision was extended longitudinally. The utero-vesical peritoneal reflection was incised transversely and the bladder flap was bluntly freed from the lower uterine segment. A low transverse uterine incision was made. Delivered from cephalic presentation was a vigerous female infant with Apgar scores of 8 at one minute and 9 at five minutes. Cord ph was sent the umbilical cord was clamped and cut cord blood was obtained for evaluation. The placenta was removed Intact and appeared normal. The uterine outline, tubes and ovaries appeared to have light, filmy adhesions.  uterus appeared normal.  Ovaries were adhered to uterus bilaterally with fallopian tubes adheatred around ovaries}. The uterine incision was closed with running locked sutures  of 0chromic gut.   Hemostasis was observed. Lavage was carried out until clear.   The left fallopian tubes was grasped w/ a babcock and a knuckle of the left tube was doubly tied off with plain gut suture.  The knuckle of tube was then excised and passed off to be sent to pathology.  The right fallopian tube could not be freed from the ovary/uterus and a large vascular pedicle.  A filsche clip was applied to the right fallopian tube.  The uterine incision was reinspected and found hemostatic.  The peritoneum was closed with 0 monocryl. The fascia was then reapproximated with running sutures of 0PDS. The subcuticular closure was performed using plain gut. The skin was closed with staples.   The patient was then placed in dorsal lithotomy position using allen stirrups.  The cervix was grasped with a ring forcep and the cerclage was visualized.  The cerclage was removed intact without difficulty.  Clot and pieces of membranes were then expressed from the LUS.  Uterus was firm and hemostasis was noted.  Instrument, sponge, and needle counts were correct prior the abdominal closure and were correct at the conclusion of the case.     Estimated Blood Loss: 600cc    Urine Output: clear  Specimens:  Specimens    None       Complications: no complications  Disposition: PACU - hemodynamically stable.   Maternal Condition: stable   Baby condition / location:  NICU  Attending Attestation: I was present and scrubbed for the entire procedure.   Signed: Surgeon(s): Zelphia Cairo

## 2011-01-13 NOTE — Progress Notes (Signed)
UR Chart review completed.  

## 2011-01-13 NOTE — Transfer of Care (Signed)
Immediate Anesthesia Transfer of Care Note  Patient: Melinda Sanders  Procedure(s) Performed:  CESAREAN SECTION  Patient Location: PACU  Anesthesia Type: Spinal  Level of Consciousness: awake, alert , oriented and patient cooperative  Airway & Oxygen Therapy: Patient Spontanous Breathing  Post-op Assessment: Report given to PACU RN and Post -op Vital signs reviewed and stable  Post vital signs: Reviewed and stable  Complications: No apparent anesthesia complications

## 2011-01-13 NOTE — Anesthesia Procedure Notes (Addendum)
Spinal Block  Patient location during procedure: OR Staffing Anesthesiologist: JACKSON, KYLE EDWARD Preanesthetic Checklist Completed: patient identified, site marked, surgical consent, pre-op evaluation, timeout performed, IV checked, risks and benefits discussed and monitors and equipment checked Spinal Block Patient position: sitting Prep: DuraPrep Patient monitoring: heart rate, cardiac monitor, continuous pulse ox and blood pressure Approach: midline Location: L3-4 Injection technique: single-shot Needle Needle type: Sprotte  Needle gauge: 24 G Needle length: 9 cm Assessment Sensory level: T4 Additional Notes Spinal Dosage in OR  Bupivicaine ml       1.6 PFMS04   mcg        150 Fentanyl mcg            20    

## 2011-01-13 NOTE — Anesthesia Postprocedure Evaluation (Signed)
  Anesthesia Post-op Note  Patient: Melinda Sanders  Procedure(s) Performed:  CESAREAN SECTION  Patient Location: [facility]'s Unit  Anesthesia Type: Spinal  Level of Consciousness: awake, alert , oriented and patient cooperative  Airway and Oxygen Therapy: Patient Spontanous Breathing and Patient connected to nasal cannula oxygen  Post-op Pain: none  Post-op Assessment: Post-op Vital signs reviewed, Patient's Cardiovascular Status Stable, Respiratory Function Stable, Patent Airway, No signs of Nausea or vomiting, Adequate PO intake and Pain level controlled  Post-op Vital Signs: Reviewed and stable  Complications: No apparent anesthesia complications

## 2011-01-13 NOTE — Anesthesia Postprocedure Evaluation (Signed)
  Anesthesia Post-op Note  Patient: Melinda Sanders  Procedure(s) Performed:  CESAREAN SECTION  Patient Location: PACU  Anesthesia Type: Spinal  Level of Consciousness: awake, alert  and oriented  Airway and Oxygen Therapy: Patient Spontanous Breathing  Post-op Pain: none  Post-op Assessment: Post-op Vital signs reviewed, Patient's Cardiovascular Status Stable, Respiratory Function Stable, Patent Airway, No signs of Nausea or vomiting, Pain level controlled, No headache, No backache, No residual numbness and No residual motor weakness  Post-op Vital Signs: Reviewed and stable  Complications: No apparent anesthesia complications

## 2011-01-13 NOTE — Progress Notes (Signed)
Pt continues to have irregular ctx despite 2.5gm of mag.  She states she is unable to tolerate this dose and requests mag down to 2gm.  No vb or lof.  Good fm  Af, VSS + FHT toco q3 abd - gravid , nt Cvx deferred  A/P:  Will decrease mag to 2gm/hr and give another dose of SQ terb.   Repeat c-section if continues to have painful ctx

## 2011-01-13 NOTE — Consult Note (Signed)
Requested to attend urgent C/S at 33.[redacted] weeks gestation to a mother who presented at ~ 22 weeks requiring a cerclage.  She has remained an inpatient since that time and is undergoing C/S now secondary to active labor and having a cerclage.   Membranes are ruptured at time of delivery and are clear. Infant in vertex and is manually extracted with spontaneous cries and active MAE with additional history of maternal magnesium sulfate possibly at risk of causing respiratory depression .    Infant has normal transition and has no dysmorphic features. She received tactile stimulation with drying and bulb suction of naso/oropharynx.  Apgar are 8 adn 9 at one and five minutes with a minus 1 for tone at 1 minute along with minus 1 for color.    Shown to parents then transported to NICU in transport isolette.  Care to [facility]>    Jeancarlos Marchena. Alphonsa Gin MD [facility]

## 2011-01-13 NOTE — Anesthesia Preprocedure Evaluation (Addendum)
Anesthesia Evaluation  Name, MR# and DOB Patient awake  General Assessment Comment  Reviewed: Allergy & Precautions, H&P , Patient's Chart, lab work & pertinent test results  Airway Mallampati: II TM Distance: >3 FB Neck ROM: full    Dental No notable dental hx.    Pulmonary  clear to auscultation  Pulmonary exam normal       Cardiovascular Exercise Tolerance: Good regular Normal    Neuro/Psych    GI/Hepatic   Endo/Other    Renal/GU      Musculoskeletal   Abdominal   Peds  Hematology   Anesthesia Other Findings   Reproductive/Obstetrics                           Anesthesia Physical Anesthesia Plan  ASA: II  Anesthesia Plan: Spinal   Post-op Pain Management:    Induction:   Airway Management Planned:   Additional Equipment:   Intra-op Plan:   Post-operative Plan:   Informed Consent: I have reviewed the patients History and Physical, chart, labs and discussed the procedure including the risks, benefits and alternatives for the proposed anesthesia with the patient or authorized representative who has indicated his/her understanding and acceptance.   Dental Advisory Given  Plan Discussed with: CRNA  Anesthesia Plan Comments: (Lab work confirmed with CRNA in room. Platelets okay. Discussed spinal anesthetic, and patient consents to the procedure:  included risk of possible headache,backache, failed block, allergic reaction, and nerve injury. This patient was asked if she had any questions or concerns before the procedure started. )        Anesthesia Quick Evaluation

## 2011-01-13 NOTE — Progress Notes (Signed)
Pt c/o contractions that have restarted since terb was given.  C/o painful ctx Q3-46min.  No vb or lof.    AF, VSS Toco - Q3-6 FHT - reassuring Cvx - 1cm - changed from yesterday am  A/P:  PTL w/ cerclage despite mag and terb Plan for repeat c-section R/B/A d/w pt - informed consent obtained. Pt desires BTL - risk of permanence d/w pt and husband.  Risk of regret discussed.  Pt desires to move forward w/ repeat c-section w/ BTL.  Informed consent

## 2011-01-14 ENCOUNTER — Encounter (HOSPITAL_COMMUNITY): Payer: Self-pay | Admitting: Obstetrics and Gynecology

## 2011-01-14 LAB — CBC
HCT: 30.9 % — ABNORMAL LOW (ref 36.0–46.0)
Hemoglobin: 10.2 g/dL — ABNORMAL LOW (ref 12.0–15.0)
MCV: 95.4 fL (ref 78.0–100.0)
RBC: 3.24 MIL/uL — ABNORMAL LOW (ref 3.87–5.11)
RDW: 13.7 % (ref 11.5–15.5)
WBC: 7.5 10*3/uL (ref 4.0–10.5)

## 2011-01-14 NOTE — Progress Notes (Signed)
Found pt in NICU with baby. Up in wheelchair. Good pain relief, voiding, tol regular diet.  VSS Afeb

## 2011-01-15 NOTE — Progress Notes (Signed)
PSYCHOSOCIAL ASSESSMENT ~ MATERNAL/CHILD Name: Melinda Sanders                                                                                        Age: 35 days   Referral Date: 01/15/11   Reason/Source: NICU Support/NICU  I. FAMILY/HOME ENVIRONMENT A. Child's Legal Guardian __x_Parent(s) ___Grandparent ___Foster parent ___DSS_________________ Name: Melinda Sanders                                                               DOB: 1975/10/09                     Age: 53  Address: [address], [place], [place] [address]  Name: Melinda Sanders                                                               DOB: //                     Age:   Address: same  B. Other Household Members/Support Persons Name: Melinda Sanders (7)           Relationship: brother           DOB ___/___/___                   Name:                                         Relationship:                        DOB ___/___/___                   Name:                                         Relationship:                        DOB ___/___/___                   Name:                                         Relationship:                        DOB ___/___/___  C. Other Support: Great support system of family and friends in the area.   II. PSYCHOSOCIAL DATA A. Information Source                                                                                                _x_Patient Interview  __Family Interview           _x_Other: chart  B. Event organiser _x_Employment: [employer], Engineer, site __Medicaid    [employer]:                 _x_Private Insurance: [facility]                  __Self Pay  __Food Stamps   __WIC __Work First     __Public Housing     __Section 8    __Maternity Care Coordination/Child Service Coordination/Early Intervention  __School:                                                                         Grade:  __Other:   Melinda Sanders Cultural and Environment Information Cultural Issues  Impacting Care: none known  III. STRENGTHS _x__Supportive family/friends _x__Adequate Resources _x__Compliance with medical plan _x__Home prepared for Child (including basic supplies) _x__Understanding of illness      _x__Other: Pediatric follow up will be at [facility].  Primary doctor is Dr. Alphonsus Sanders IV. RISK FACTORS AND CURRENT PROBLEMS         __x__No Problems Noted  Pt              Family          Substance Abuse                                                                   ___              ___             Mental Illness                                                                        ___              ___  Family/Relationship Issues                                      ___               ___             Abuse/Neglect/Domestic Violence                                         ___         ___  Financial Resources                                        ___              ___             Transportation                                                                        ___               ___  DSS Involvement                                                                   ___              ___  Adjustment to Illness  ___              ___  Knowledge/Cognitive Deficit                                                   ___              ___             Compliance with Treatment                                                 ___              ___  Basic Needs (food, housing, etc.)                                          ___              ___             Housing Concerns                                       ___               ___ Other_____________________________________________________________            V. SOCIAL WORK ASSESSMENT SW met with MOB in her third floor room to introduce myself, complete assessment and evaluate how she is coping with baby's admission to NICU.  MOB was extremely friendly and talkative.  She seems to be coping extremely well with baby's admission to NICU and her 11 weeks spent in the hospital on bed rest.  She told SW that she was on bed rest at home since April.  She is glad to have this behind her and glad that Melinda Sanders is here and doing great.  She told SW that her son was born at 46 weeks 7 years ago at [facility] and had to be transported to [place], where he stayed two weeks.  She said this was hard because she couldn't see him until after she was discharged from the hospital and then he was far away.  She states she thinks it is easier this time having been through it before and knowing what to expect.  She is hopeful that Melinda Sanders will not have to be here very long since she is doing so well.  We spoke at length about how her son is reacting to everything and how she plans to help him adjust to his baby sister.  SW informed MOB of Sibling Orientation offered by [employer] and gave brochure.  MOB states she has a great support system of family and friends who live in the area.  Her husband will have some time off now so he can help transport her back and forth to visit baby after her discharge since she will not be able to drive for a couple weeks.  She states they have gotten everything they need for baby.  She had things ordered and  shipped to her home and then helped her husband arrange the nursery over Skype.  She has been working from [employer] and thinks she will have 8 weeks maternity leave now.  She states her husband's schedule is crazy, but that he has a lot of time he can take.  SW explained support services offered by NICU SWs and gave contact information.  SW has no  social concerns and MOB states no questions or needs at this time.  She seemed appreciative of SW's visit.  VI. SOCIAL WORK PLAN  ___No Further Intervention Required/No Barriers to Discharge   _x__Psychosocial Support and Ongoing Assessment of Needs   ___Patient/Family Education:   ___Child Protective Services Report   County___________ Date___/____/____   ___Information/Referral to [employer]_________________________   ___Other:

## 2011-01-16 MED ORDER — OXYCODONE-ACETAMINOPHEN 5-325 MG PO TABS
1.0000 | ORAL_TABLET | ORAL | Status: AC | PRN
Start: 2011-01-16 — End: 2011-01-26

## 2011-01-16 MED ORDER — IBUPROFEN 600 MG PO TABS: 600.0000 mg | ORAL_TABLET | Freq: Four times a day (QID) | ORAL | Status: AC

## 2011-01-16 NOTE — Discharge Summary (Signed)
Obstetric Discharge Summary Reason for Admission: PTL and Cervical Incompetence Prenatal Procedures: NST, ultrasound and and tocolysis, and steroids Intrapartum Procedures: cesarean: low cervical, transverse Postpartum Procedures: none Complications-Operative and Postpartum: none Hemoglobin  Date Value Range Status  01/14/2011 10.2* 12.0-15.0 (g/dL) Final     HCT  Date Value Range Status  01/14/2011 30.9* 36.0-46.0 (%) Final    Discharge Diagnoses: Premature labor  Discharge Information: Date: 01/16/2011 Activity: unrestricted Diet: routine and ibuprofen and percocet Medications: Ibuprofen and Percocet Condition: improved Instructions: refer to practice specific booklet Discharge to: home   Newborn Data: Live born female  Birth Weight:  APGAR: 8, 9  Home with nicu.  Melinda Sanders 01/16/2011, 9:00 AM

## 2011-01-16 NOTE — Progress Notes (Signed)
Subjective: Postpartum Day 3: Cesarean Delivery Patient reports no problems voiding.  Baby in Nicu doing well  Objective: Vital signs in last 24 hours: Temp:  [97.8 F (36.6 C)-99 F (37.2 C)] 98.9 F (37.2 C) (10/05 0516) Pulse Rate:  [78-100] 78  (10/05 0516) Resp:  [18] 18  (10/05 0516) BP: (132-133)/(79-87) 132/79 mmHg (10/05 0516) SpO2:  [94 %-96 %] 94 % (10/05 0516)  Physical Exam:  General: alert, cooperative and appears stated age Lochia: appropriate Uterine Fundus: firm Incision: healing well, no significant drainage, no dehiscence, no significant erythema DVT Evaluation: No evidence of DVT seen on physical exam.   Basename 01/14/11 0530 01/13/11 1123  HGB 10.2* 12.1  HCT 30.9* 35.2*    Assessment/Plan: Status post Cesarean section. Doing well postoperatively.  Discharge home with standard precautions and return to clinic in 1 week Rx Percocet and Rx Ibuprofen Discharge Precautions.  Melinda Sanders L 01/16/2011, 8:59 AM

## 2011-01-16 NOTE — Progress Notes (Signed)
Pt discharged home with husband... Condition stable... No equipment... Ambulated to car with Lezlie Lye, NT.

## 2011-02-24 ENCOUNTER — Other Ambulatory Visit (HOSPITAL_COMMUNITY): Payer: Self-pay | Admitting: Obstetrics and Gynecology

## 2011-02-24 DIAGNOSIS — N971 Female infertility of tubal origin: Secondary | ICD-10-CM

## 2011-04-02 ENCOUNTER — Ambulatory Visit (HOSPITAL_COMMUNITY)
Admission: RE | Admit: 2011-04-02 | Discharge: 2011-04-02 | Disposition: A | Discharge status: Home / Self Care | Payer: 59 | Source: Ambulatory Visit | Attending: Obstetrics and Gynecology | Admitting: Obstetrics and Gynecology

## 2011-04-02 DIAGNOSIS — Z9851 Tubal ligation status: Secondary | ICD-10-CM | POA: Insufficient documentation

## 2011-04-02 DIAGNOSIS — N949 Unspecified condition associated with female genital organs and menstrual cycle: Secondary | ICD-10-CM | POA: Insufficient documentation

## 2011-04-02 DIAGNOSIS — N971 Female infertility of tubal origin: Secondary | ICD-10-CM

## 2011-04-02 LAB — PREGNANCY, URINE: Preg Test, Ur: NEGATIVE

## 2011-04-02 MED ORDER — IOHEXOL 300 MG/ML  SOLN: 5.0000 mL | Freq: Once | INTRAMUSCULAR | Status: AC | PRN

## 2014-02-12 ENCOUNTER — Encounter (HOSPITAL_COMMUNITY): Payer: Self-pay | Admitting: Obstetrics and Gynecology

## 2014-05-16 ENCOUNTER — Ambulatory Visit (INDEPENDENT_AMBULATORY_CARE_PROVIDER_SITE_OTHER): Payer: 59 | Admitting: Physician Assistant

## 2014-05-16 ENCOUNTER — Encounter: Payer: Self-pay | Admitting: Physician Assistant

## 2014-05-16 VITALS — BP 146/90 | HR 96 | Wt 218.0 lb

## 2014-05-16 DIAGNOSIS — IMO0001 Reserved for inherently not codable concepts without codable children: Secondary | ICD-10-CM

## 2014-05-16 DIAGNOSIS — E669 Obesity, unspecified: Secondary | ICD-10-CM

## 2014-05-16 DIAGNOSIS — Z124 Encounter for screening for malignant neoplasm of cervix: Secondary | ICD-10-CM

## 2014-05-16 DIAGNOSIS — Z01419 Encounter for gynecological examination (general) (routine) without abnormal findings: Secondary | ICD-10-CM

## 2014-05-16 DIAGNOSIS — Z1151 Encounter for screening for human papillomavirus (HPV): Secondary | ICD-10-CM

## 2014-05-16 DIAGNOSIS — R030 Elevated blood-pressure reading, without diagnosis of hypertension: Secondary | ICD-10-CM

## 2014-05-16 DIAGNOSIS — Z01411 Encounter for gynecological examination (general) (routine) with abnormal findings: Secondary | ICD-10-CM

## 2014-05-16 NOTE — Patient Instructions (Signed)
DASH Eating Plan °DASH stands for "Dietary Approaches to Stop Hypertension." The DASH eating plan is a healthy eating plan that has been shown to reduce high blood pressure (hypertension). Additional health benefits may include reducing the risk of type 2 diabetes mellitus, heart disease, and stroke. The DASH eating plan may also help with weight loss. °WHAT DO I NEED TO KNOW ABOUT THE DASH EATING PLAN? °For the DASH eating plan, you will follow these general guidelines: °· Choose foods with a percent daily value for sodium of less than 5% (as listed on the food label). °· Use salt-free seasonings or herbs instead of table salt or sea salt. °· Check with your health care provider or pharmacist before using salt substitutes. °· Eat lower-sodium products, often labeled as "lower sodium" or "no salt added." °· Eat fresh foods. °· Eat more vegetables, fruits, and low-fat dairy products. °· Choose whole grains. Look for the word "whole" as the first word in the ingredient list. °· Choose fish and skinless chicken or turkey more often than red meat. Limit fish, poultry, and meat to 6 oz (170 g) each day. °· Limit sweets, desserts, sugars, and sugary drinks. °· Choose heart-healthy fats. °· Limit cheese to 1 oz (28 g) per day. °· Eat more home-cooked food and less restaurant, buffet, and fast food. °· Limit fried foods. °· Cook foods using methods other than frying. °· Limit canned vegetables. If you do use them, rinse them well to decrease the sodium. °· When eating at a restaurant, ask that your food be prepared with less salt, or no salt if possible. °WHAT FOODS CAN I EAT? °Seek help from a dietitian for individual calorie needs. °Grains °Whole grain or whole wheat bread. Vallance rice. Whole grain or whole wheat pasta. Quinoa, bulgur, and whole grain cereals. Low-sodium cereals. Corn or whole wheat flour tortillas. Whole grain cornbread. Whole grain crackers. Low-sodium crackers. °Vegetables °Fresh or frozen vegetables  (raw, steamed, roasted, or grilled). Low-sodium or reduced-sodium tomato and vegetable juices. Low-sodium or reduced-sodium tomato sauce and paste. Low-sodium or reduced-sodium canned vegetables.  °Fruits °All fresh, canned (in natural juice), or frozen fruits. °Meat and Other Protein Products °Ground beef (85% or leaner), grass-fed beef, or beef trimmed of fat. Skinless chicken or turkey. Ground chicken or turkey. Pork trimmed of fat. All fish and seafood. Eggs. Dried beans, peas, or lentils. Unsalted nuts and seeds. Unsalted canned beans. °Dairy °Low-fat dairy products, such as skim or 1% milk, 2% or reduced-fat cheeses, low-fat ricotta or cottage cheese, or plain low-fat yogurt. Low-sodium or reduced-sodium cheeses. °Fats and Oils °Tub margarines without trans fats. Light or reduced-fat mayonnaise and salad dressings (reduced sodium). Avocado. Safflower, olive, or canola oils. Natural peanut or almond butter. °Other °Unsalted popcorn and pretzels. °The items listed above may not be a complete list of recommended foods or beverages. Contact your dietitian for more options. °WHAT FOODS ARE NOT RECOMMENDED? °Grains °White bread. White pasta. White rice. Refined cornbread. Bagels and croissants. Crackers that contain trans fat. °Vegetables °Creamed or fried vegetables. Vegetables in a cheese sauce. Regular canned vegetables. Regular canned tomato sauce and paste. Regular tomato and vegetable juices. °Fruits °Dried fruits. Canned fruit in light or heavy syrup. Fruit juice. °Meat and Other Protein Products °Fatty cuts of meat. Ribs, chicken wings, bacon, sausage, bologna, salami, chitterlings, fatback, hot dogs, bratwurst, and packaged luncheon meats. Salted nuts and seeds. Canned beans with salt. °Dairy °Whole or 2% milk, cream, half-and-half, and cream cheese. Whole-fat or sweetened yogurt. Full-fat   cheeses or blue cheese. Nondairy creamers and whipped toppings. Processed cheese, cheese spreads, or cheese  curds. °Condiments °Onion and garlic salt, seasoned salt, table salt, and sea salt. Canned and packaged gravies. Worcestershire sauce. Tartar sauce. Barbecue sauce. Teriyaki sauce. Soy sauce, including reduced sodium. Steak sauce. Fish sauce. Oyster sauce. Cocktail sauce. Horseradish. Ketchup and mustard. Meat flavorings and tenderizers. Bouillon cubes. Hot sauce. Tabasco sauce. Marinades. Taco seasonings. Relishes. °Fats and Oils °Butter, stick margarine, lard, shortening, ghee, and bacon fat. Coconut, palm kernel, or palm oils. Regular salad dressings. °Other °Pickles and olives. Salted popcorn and pretzels. °The items listed above may not be a complete list of foods and beverages to avoid. Contact your dietitian for more information. °WHERE CAN I FIND MORE INFORMATION? °National Heart, Lung, and Blood Institute: www.nhlbi.nih.gov/health/health-topics/topics/dash/ °Document Released: 03/19/2011 Document Revised: 08/14/2013 Document Reviewed: 02/01/2013 °ExitCare® Patient Information ©2015 [employer] LLC. This information is not intended to replace advice given to you by your health care provider. Make sure you discuss any questions you have with your health care provider. ° °

## 2014-05-16 NOTE — Progress Notes (Signed)
Patient ID: Melinda MiresPage Homewood, female   DOB: 02/05/1976, 39 y.o.   MRN: [id number] History:  Melinda Sanders is a 39 y.o. [id number] who presents to clinic today for annual exam.  She has not been seen since the birth of her 39 year old daughter.  She has no complaints today except a little lateral soreness of the breasts.  She had a BTL previously.     The following portions of the patient's history were reviewed and updated as appropriate: allergies, current medications, past family history, past medical history, past social history, past surgical history and problem list.  Review of Systems:  Pertinent items are noted in HPI.  Objective:  Physical Exam BP 172/115 mmHg  Pulse 96  Wt 218 lb (98.884 kg) GENERAL: Well-developed, overweight female in no acute distress.  HEENT: Normocephalic, atraumatic.  NECK: Supple. Normal thyroid.  LUNGS: Normal rate. Clear to auscultation bilaterally.  HEART: Regular rate and rhythm with no adventitious sounds.  BREASTS: Symmetric in size. No masses, nipple drainage, or lymphadenopathy. Skin tags noted under breasts. ABDOMEN: Soft, nontender, nondistended. No organomegaly appreciated. Normal bowel sounds appreciated in all quadrants.  PELVIC: Normal external female genitalia. Vagina is pink and rugated.  Normal discharge. Normal cervix contour. Pap smear obtained.  No bleeding following pap.   Uterus is normal in size. No adnexal mass or tenderness.  EXTREMITIES: No cyanosis, clubbing, or edema, 2+ distal pulses.  Blood pressure repeated with manual cuff and found to be 146/90  Labs and Imaging No results found.  Assessment & Plan:  Assessment: 1. Elevated blood pressure   2. Obesity      Plans: BTL for contraception Patient advised to see PCP for eval of blood pressure Await pap results Advised to trial different bras and keep track as pain sounds situational.   Return to clinic prn or for annual exam.    Bertram DenverKaren E Teague Clark, PA-C 05/16/2014 3:37  PM

## 2014-05-16 NOTE — Addendum Note (Signed)
Addended by: Tandy GawHINTON, Alithea Lapage C on: 05/16/2014 03:48 PM   Modules accepted: Orders

## 2014-05-21 LAB — CYTOLOGY - PAP

## 2014-09-11 ENCOUNTER — Other Ambulatory Visit: Payer: Self-pay | Admitting: Internal Medicine

## 2014-09-11 DIAGNOSIS — R1011 Right upper quadrant pain: Secondary | ICD-10-CM

## 2014-09-13 ENCOUNTER — Ambulatory Visit
Admission: RE | Admit: 2014-09-13 | Discharge: 2014-09-13 | Disposition: A | Discharge status: Home / Self Care | Payer: 59 | Source: Ambulatory Visit | Attending: Internal Medicine | Admitting: Internal Medicine

## 2014-09-13 DIAGNOSIS — R1011 Right upper quadrant pain: Secondary | ICD-10-CM | POA: Diagnosis not present

## 2015-01-07 ENCOUNTER — Telehealth: Payer: Self-pay | Admitting: *Deleted

## 2015-01-07 DIAGNOSIS — N898 Other specified noninflammatory disorders of vagina: Secondary | ICD-10-CM

## 2015-01-07 MED ORDER — FLUCONAZOLE 150 MG PO TABS: 150.0000 mg | ORAL_TABLET | Freq: Once | ORAL | Status: DC

## 2015-01-07 NOTE — Telephone Encounter (Signed)
-----   Message from Pennie Banter sent at 01/07/2015  8:55 AM EDT ----- Jeanene Erb, she has yeast infection since last Wednesday, tried Monostat with no relief. She would like something called in.  Uses [facility] [address]

## 2015-01-07 NOTE — Telephone Encounter (Signed)
Pt c/o vaginal itching and slight discharge.  Used OTC Monistat with little relief.  Sent rx for Diflucan to pharmacy per protocol.  Informed pt of use and if symptoms persist after treatment she will need to make an appt to see the physician.

## 2015-01-22 ENCOUNTER — Ambulatory Visit (INDEPENDENT_AMBULATORY_CARE_PROVIDER_SITE_OTHER): Payer: Commercial Managed Care - HMO | Admitting: Obstetrics & Gynecology

## 2015-01-22 ENCOUNTER — Encounter: Payer: Self-pay | Admitting: Obstetrics & Gynecology

## 2015-01-22 VITALS — BP 111/77 | HR 103 | Wt 211.0 lb

## 2015-01-22 DIAGNOSIS — I10 Essential (primary) hypertension: Secondary | ICD-10-CM | POA: Insufficient documentation

## 2015-01-22 DIAGNOSIS — N898 Other specified noninflammatory disorders of vagina: Secondary | ICD-10-CM | POA: Diagnosis not present

## 2015-01-22 DIAGNOSIS — B373 Candidiasis of vulva and vagina: Secondary | ICD-10-CM

## 2015-01-22 DIAGNOSIS — B3731 Acute candidiasis of vulva and vagina: Secondary | ICD-10-CM

## 2015-01-22 MED ORDER — NYSTATIN 100000 UNIT/GM EX CREA: 1.0000 "application " | TOPICAL_CREAM | Freq: Two times a day (BID) | CUTANEOUS | Status: DC

## 2015-01-22 MED ORDER — FLUCONAZOLE 150 MG PO TABS: 150.0000 mg | ORAL_TABLET | ORAL | Status: DC

## 2015-01-22 NOTE — Progress Notes (Signed)
   CLINIC ENCOUNTER NOTE  History:  39 y.o. [id number] here today for evaluation of vulvar irritation and abnormal discharge x 3 weeks.  Initially took Diflucan and had some relief but had to take antibiotics and symptoms resumed and got worse.  Has used OTC Vagisil and Monistat without relief. She denies any abnormal vaginal bleeding, pelvic pain or other concerns.   Past Medical History  Diagnosis Date  . HTN (hypertension)     Past Surgical History  Procedure Laterality Date  . Cervical cerclage  August 06, 2010  . Cervical cerclage  January 13, 2003  . Cesarean section  May 26, 2003  . Dilation and curettage of uterus  June 15, 2002  . Cesarean section  01/13/2011    Procedure: CESAREAN SECTION;  Surgeon: Zelphia Cairo;  Location: [facility];  Service: Gynecology;  Laterality: N/A;   The following portions of the patient's history were reviewed and updated as appropriate: allergies, current medications, past family history, past medical history, past social history, past surgical history and problem list.   Health Maintenance:  Normal pap and negative HRHPV on 05/16/2014.    Review of Systems:  Pertinent items noted in HPI and remainder of comprehensive ROS otherwise negative.  Objective:  Physical Exam BP 111/77 mmHg  Pulse 103  Wt 211 lb (95.709 kg)  LMP 01/08/2015  Breastfeeding? No CONSTITUTIONAL: Well-developed, well-nourished female in no acute distress.  HENT:  Normocephalic, atraumatic. External right and left ear normal. Oropharynx is clear and moist EYES: Conjunctivae and EOM are normal. Pupils are equal, round, and reactive to light. No scleral icterus.  NECK: Normal range of motion, supple, no masses SKIN: Skin is warm and dry. No rash noted. Not diaphoretic. No erythema. No pallor. NEUROLGIC: Alert and oriented to person, place, and time. Normal reflexes, muscle tone coordination. No cranial nerve deficit noted. PSYCHIATRIC: Normal mood and affect. Normal behavior.  Normal judgment and thought content. CARDIOVASCULAR: Normal heart rate noted RESPIRATORY: Effort and breath sounds normal, no problems with respiration noted ABDOMEN: Soft, no distention noted.   PELVIC: Erythematous, inflammed external genitalia; white discharge noted. Tender to touch.  Sample obtained for wet prep.  MUSCULOSKELETAL: Normal range of motion. No edema noted.   Assessment & Plan:   Vaginal discharge/Yeast infection involving the vagina and surrounding area Likely complicated yeast vulvovaginitis.  Extended course of Diflucan and Nystatin prescribed - Wet prep, genital - fluconazole (DIFLUCAN) 150 MG tablet; Take 1 tablet (150 mg total) by mouth every 3 (three) days. For three doses  Dispense: 3 tablet; Refill: 3 - nystatin cream (MYCOSTATIN); Apply 1 application topically 2 (two) times daily.  Dispense: 30 g; Refill: 1 Told to call or return for worsening or more concerning symptoms. Routine preventative health maintenance measures emphasized. Please refer to After Visit Summary for other counseling recommendations.    Jaynie Collins, MD, FACOG Attending Obstetrician & Gynecologist, [facility]

## 2015-01-22 NOTE — Patient Instructions (Signed)

## 2015-01-23 LAB — WET PREP, GENITAL
Trich, Wet Prep: NONE SEEN
WBC, Wet Prep HPF POC: NONE SEEN
YEAST WET PREP: NONE SEEN

## 2015-06-13 ENCOUNTER — Emergency Department
Admission: EM | Admit: 2015-06-13 | Discharge: 2015-06-13 | Disposition: A | Discharge status: Home / Self Care | Payer: Commercial Managed Care - HMO | Attending: Emergency Medicine | Admitting: Emergency Medicine

## 2015-06-13 ENCOUNTER — Emergency Department: Payer: Commercial Managed Care - HMO

## 2015-06-13 ENCOUNTER — Encounter: Payer: Self-pay | Admitting: Emergency Medicine

## 2015-06-13 DIAGNOSIS — Z3202 Encounter for pregnancy test, result negative: Secondary | ICD-10-CM | POA: Diagnosis not present

## 2015-06-13 DIAGNOSIS — R1011 Right upper quadrant pain: Secondary | ICD-10-CM | POA: Diagnosis not present

## 2015-06-13 DIAGNOSIS — I10 Essential (primary) hypertension: Secondary | ICD-10-CM | POA: Insufficient documentation

## 2015-06-13 DIAGNOSIS — Z79899 Other long term (current) drug therapy: Secondary | ICD-10-CM | POA: Diagnosis not present

## 2015-06-13 DIAGNOSIS — E119 Type 2 diabetes mellitus without complications: Secondary | ICD-10-CM | POA: Diagnosis not present

## 2015-06-13 DIAGNOSIS — R109 Unspecified abdominal pain: Secondary | ICD-10-CM

## 2015-06-13 DIAGNOSIS — Z87891 Personal history of nicotine dependence: Secondary | ICD-10-CM | POA: Diagnosis not present

## 2015-06-13 DIAGNOSIS — R1031 Right lower quadrant pain: Secondary | ICD-10-CM | POA: Diagnosis present

## 2015-06-13 HISTORY — DX: Type 2 diabetes mellitus without complications: E11.9

## 2015-06-13 LAB — COMPREHENSIVE METABOLIC PANEL
ALT: 43 U/L (ref 14–54)
ANION GAP: 8 (ref 5–15)
AST: 27 U/L (ref 15–41)
Albumin: 4.4 g/dL (ref 3.5–5.0)
Alkaline Phosphatase: 92 U/L (ref 38–126)
BUN: 11 mg/dL (ref 6–20)
CALCIUM: 9.5 mg/dL (ref 8.9–10.3)
CO2: 29 mmol/L (ref 22–32)
CREATININE: 0.79 mg/dL (ref 0.44–1.00)
Chloride: 100 mmol/L — ABNORMAL LOW (ref 101–111)
Glucose, Bld: 128 mg/dL — ABNORMAL HIGH (ref 65–99)
Potassium: 3.5 mmol/L (ref 3.5–5.1)
Sodium: 137 mmol/L (ref 135–145)
Total Bilirubin: 0.8 mg/dL (ref 0.3–1.2)
Total Protein: 7.9 g/dL (ref 6.5–8.1)

## 2015-06-13 LAB — URINALYSIS COMPLETE WITH MICROSCOPIC (ARMC ONLY)
BILIRUBIN URINE: NEGATIVE
Glucose, UA: NEGATIVE mg/dL
HGB URINE DIPSTICK: NEGATIVE
KETONES UR: NEGATIVE mg/dL
LEUKOCYTES UA: NEGATIVE
Nitrite: NEGATIVE
PH: 5 (ref 5.0–8.0)
PROTEIN: NEGATIVE mg/dL
Specific Gravity, Urine: 1.005 (ref 1.005–1.030)

## 2015-06-13 LAB — CBC
HCT: 43.6 % (ref 35.0–47.0)
HEMOGLOBIN: 15.2 g/dL (ref 12.0–16.0)
MCH: 31.3 pg (ref 26.0–34.0)
MCHC: 34.8 g/dL (ref 32.0–36.0)
MCV: 90 fL (ref 80.0–100.0)
PLATELETS: 275 10*3/uL (ref 150–440)
RBC: 4.84 MIL/uL (ref 3.80–5.20)
RDW: 13.5 % (ref 11.5–14.5)
WBC: 8.9 10*3/uL (ref 3.6–11.0)

## 2015-06-13 LAB — LIPASE, BLOOD: LIPASE: 25 U/L (ref 11–51)

## 2015-06-13 LAB — PREGNANCY, URINE: Preg Test, Ur: NEGATIVE

## 2015-06-13 MED ORDER — ONDANSETRON HCL 4 MG/2ML IJ SOLN
4.0000 mg | Freq: Once | INTRAMUSCULAR | Status: AC
  Administered 2015-06-13: 4 mg via INTRAVENOUS

## 2015-06-13 MED ORDER — MORPHINE SULFATE (PF) 4 MG/ML IV SOLN
4.0000 mg | Freq: Once | INTRAVENOUS | Status: AC
  Administered 2015-06-13: 4 mg via INTRAVENOUS

## 2015-06-13 MED ORDER — IOHEXOL 240 MG/ML SOLN
25.0000 mL | Freq: Once | INTRAMUSCULAR | Status: AC | PRN
  Administered 2015-06-13: 25 mL via ORAL
  Filled 2015-06-13: qty 25

## 2015-06-13 MED ORDER — ONDANSETRON HCL 4 MG/2ML IJ SOLN
INTRAMUSCULAR | Status: AC
Start: 2015-06-13 — End: 2015-06-13
  Administered 2015-06-13: 4 mg via INTRAVENOUS
  Filled 2015-06-13: qty 2

## 2015-06-13 MED ORDER — IOHEXOL 300 MG/ML  SOLN
100.0000 mL | Freq: Once | INTRAMUSCULAR | Status: AC | PRN
  Administered 2015-06-13: 100 mL via INTRAVENOUS
  Filled 2015-06-13: qty 100

## 2015-06-13 MED ORDER — CARISOPRODOL 350 MG PO TABS
350.0000 mg | ORAL_TABLET | Freq: Three times a day (TID) | ORAL | Status: DC | PRN
Start: 2015-06-13 — End: 2016-05-27

## 2015-06-13 MED ORDER — SODIUM CHLORIDE 0.9 % IV BOLUS (SEPSIS)
1000.0000 mL | Freq: Once | INTRAVENOUS | Status: AC
  Administered 2015-06-13: 1000 mL via INTRAVENOUS

## 2015-06-13 MED ORDER — MORPHINE SULFATE (PF) 4 MG/ML IV SOLN
INTRAVENOUS | Status: AC
  Administered 2015-06-13: 4 mg via INTRAVENOUS
  Filled 2015-06-13: qty 1

## 2015-06-13 NOTE — ED Notes (Signed)
IV fluids infusing appropriately. Patient transported to CT.

## 2015-06-13 NOTE — Discharge Instructions (Signed)

## 2015-06-13 NOTE — ED Notes (Signed)
Presents with RLQ pain since last pm  Denies any n/v/d or fever or Urinary sxs'  Pain increases with movement   Was seen at urgent care in [place] ..they are concerned about appendix

## 2015-06-13 NOTE — ED Provider Notes (Addendum)
[facility] Emergency Department Provider Note  ____________________________________________  Time seen: Approximately 540 PM  I have reviewed the triage vital signs and the nursing notes.   HISTORY  Chief Complaint Abdominal Pain    HPI Melinda Sanders is a 40 y.o. female with a history of hypertension and diabetes who is presenting to the emergency department with right-sided abdominal pain since yesterday afternoon. She says that the pain is worse when she moves and is intermittent when she is sitting. She said that she is only a small amount of food today secondary to loss of her appetite. She denies any nausea, vomiting or diarrhea. Says that there is a family history of gallbladder disease in her family. Says that she was seen in urgent care in [facility] and sent to the emergency department for further evaluation.Denies any vaginal bleeding or discharge. No pain with urination. Urgent care was concerned about appendicitis because right lower quadrant abdominal pain. Says that the pain radiates to her back.   Past Medical History  Diagnosis Date  . HTN (hypertension)   . Diabetes mellitus without complication [facility])     Patient Active Problem List   Diagnosis Date Noted  . HTN (hypertension)   . Obesity 05/16/2014    Past Surgical History  Procedure Laterality Date  . Cervical cerclage  August 06, 2010  . Cervical cerclage  January 13, 2003  . Cesarean section  May 26, 2003  . Dilation and curettage of uterus  June 15, 2002  . Cesarean section  01/13/2011    Procedure: CESAREAN SECTION;  Surgeon: Zelphia Cairo;  Location: [facility];  Service: Gynecology;  Laterality: N/A;    Current Outpatient Rx  Name  Route  Sig  Dispense  Refill  . carvedilol (COREG) 12.5 MG tablet   Oral   Take 12.5 mg by mouth 2 (two) times daily.      5   . fluconazole (DIFLUCAN) 150 MG tablet   Oral   Take 1 tablet (150 mg total) by mouth every 3 (three) days. For  three doses   3 tablet   3   . hydrochlorothiazide (HYDRODIURIL) 25 MG tablet   Oral   Take 25 mg by mouth daily.      5   . nystatin cream (MYCOSTATIN)   Topical   Apply 1 application topically 2 (two) times daily.   30 g   1     Allergies Review of patient's allergies indicates no known allergies.  Family History  Problem Relation Age of Onset  . Diabetes Mother   . Miscarriages / [place] Mother   . Hyperlipidemia Father   . Hypertension Father   . Cancer Maternal Grandmother   . Diabetes Maternal Grandmother   . Early death Maternal Grandmother   . Mental illness Maternal Grandmother   . Cancer Paternal Grandmother   . Hypertension Paternal Grandfather     Social History Social History  Substance Use Topics  . Smoking status: Former Smoker    Quit date: 06/24/2010  . Smokeless tobacco: None  . Alcohol Use: No    Review of Systems Constitutional: No fever/chills Eyes: No visual changes. ENT: No sore throat. Cardiovascular: Denies chest pain. Respiratory: Denies shortness of breath. Gastrointestinal: No nausea, no vomiting.  No diarrhea.  No constipation. Genitourinary: Negative for dysuria. Musculoskeletal: Negative for back pain. Skin: Negative for rash. Neurological: Negative for headaches, focal weakness or numbness.  10-point ROS otherwise negative.  ____________________________________________   PHYSICAL EXAM:  VITAL SIGNS: ED Triage Vitals  Enc Vitals Group     BP 06/13/15 1524 119/76 mmHg     Pulse Rate 06/13/15 1524 98     Resp 06/13/15 1524 18     Temp 06/13/15 1524 98.7 F (37.1 C)     Temp Source 06/13/15 1524 Oral     SpO2 06/13/15 1524 95 %     Weight 06/13/15 1524 198 lb (89.812 kg)     Height 06/13/15 1524 5\' 5"  (1.651 m)     Head Cir --      Peak Flow --      Pain Score 06/13/15 1524 5     Pain Loc --      Pain Edu? --      Excl. in GC? --     Constitutional: Alert and oriented. Well appearing and in no acute  distress. Eyes: Conjunctivae are normal. PERRL. EOMI. Head: Atraumatic. Nose: No congestion/rhinnorhea. Mouth/Throat: Mucous membranes are moist.  Oropharynx non-erythematous. Neck: No stridor.   Cardiovascular: Normal rate, regular rhythm. Grossly normal heart sounds.  Good peripheral circulation. Respiratory: Normal respiratory effort.  No retractions. Lungs CTAB. Gastrointestinal: Soft with right-sided abdominal tenderness which is concentrated to the right upper quadrant. No right lower quadrant tenderness to palpation. There is guarding to the right upper quadrant. No distention. No CVA tenderness. Musculoskeletal: No lower extremity tenderness nor edema.  No joint effusions. Neurologic:  Normal speech and language. No gross focal neurologic deficits are appreciated.  Skin:  Skin is warm, dry and intact. No rash noted. Psychiatric: Mood and affect are normal. Speech and behavior are normal.  ____________________________________________   LABS (all labs ordered are listed, but only abnormal results are displayed)  Labs Reviewed  COMPREHENSIVE METABOLIC PANEL - Abnormal; Notable for the following:    Chloride 100 (*)    Glucose, Bld 128 (*)    All other components within normal limits  URINALYSIS COMPLETEWITH MICROSCOPIC ([facility] ONLY) - Abnormal; Notable for the following:    Color, Urine STRAW (*)    APPearance CLEAR (*)    Bacteria, UA MANY (*)    Squamous Epithelial / LPF 0-5 (*)    All other components within normal limits  LIPASE, BLOOD  CBC  PREGNANCY, URINE  POC URINE PREG, ED   ____________________________________________  EKG   ____________________________________________  RADIOLOGY     CT Abdomen Pelvis W Contrast (Final result) Result time: 06/13/15 20:01:57   Final result by Rad Results In Interface (06/13/15 20:01:57)   Narrative:   CLINICAL DATA: Right lower quadrant pain starting this morning  EXAM: CT ABDOMEN AND PELVIS WITH  CONTRAST  TECHNIQUE: Multidetector CT imaging of the abdomen and pelvis was performed using the standard protocol following bolus administration of intravenous contrast.  CONTRAST: OMNIPAQUE IOHEXOL 300 MG/ML SOLN  COMPARISON: None.  FINDINGS: Lower chest: No acute findings.  Hepatobiliary: There is fatty infiltration of the liver. No focal hepatic mass. No intrahepatic biliary ductal dilatation.  Pancreas: No focal mass. No peripancreatic fluid or inflammation.  Spleen: Within normal limits in size and appearance.  Adrenals/Urinary Tract: Adrenal glands are unremarkable. Kidneys are symmetrical in size and enhancement. No hydronephrosis or hydroureter.  Axial image 44 there is calcified nonobstructive calculus in midpole of the right kidney measures 3.6 mm.  The urinary bladder is unremarkable. No bladder filling defects are noted.  Stomach/Bowel: There is no gastric outlet obstruction. No thickening of caustic wall. No small bowel obstruction. No thickened or dilated small bowel loops  are noted.  No pericecal inflammation. Normal appendix is noted in axial image 67. The terminal ileum is unremarkable. No distal colonic obstruction. No evidence of colitis or diverticulitis.  Vascular/Lymphatic: No pathologically enlarged lymph nodes. No evidence of abdominal aortic aneurysm.  Reproductive: No mass or other significant abnormality. The uterus is anteflexed. Unremarkable ovaries.  Other: A surgical clip is noted in right tubal region. Tiny umbilical hernia containing fat without evidence of acute complication.  Musculoskeletal: Sagittal images of the spine shows no destructive bony lesions. No acute fractures are noted.  IMPRESSION: 1. Fatty infiltration of the liver is noted. 2. No hydronephrosis or hydroureter. 3. There is nonobstructive calcified calculus in midpole of the right kidney measures 3.6 mm. 4. Normal appendix. No pericecal  inflammation. No evidence of colitis or diverticulitis. 5. Tiny umbilical hernia containing fat without evidence of acute complication.   Electronically Signed By: Natasha Mead M.D. On: 06/13/2015 20:01          US Abdomen Limited RUQ (Final result) Result time: 06/13/15 18:24:28   Final result by Rad Results In Interface (06/13/15 18:24:28)   Narrative:   CLINICAL DATA: 40 year old female with right upper quadrant abdominal pain since yesterday  EXAM: US ABDOMEN LIMITED - RIGHT UPPER QUADRANT  COMPARISON: Ultrasound dated 09/13/2014  FINDINGS: Gallbladder:  No gallstones or wall thickening visualized. No sonographic Murphy sign noted by sonographer.  Common bile duct:  Diameter: 4 mm  Liver:  There is diffuse increased hepatic echotexture compatible with fatty liver.  IMPRESSION: No gallstones.  Fatty liver.   Electronically Signed By: Elgie Collard M.D. On: 06/13/2015 18:24       ____________________________________________   PROCEDURES  ____________________________________________   INITIAL IMPRESSION / ASSESSMENT AND PLAN / ED COURSE  Pertinent labs & imaging results that were available during my care of the patient were reviewed by me and considered in my medical decision making (see chart for details).  ----------------------------------------- 8:58 PM on 06/13/2015 -----------------------------------------  Pain relieved with morphine. Very reassuring gallbladder as well as abdominal CT imaging. Discussed the imaging as well as lab work with the patient. I also discussed the urinalysis and she is saying that she has no dysuria or frequency. Most likely abdominal wall pain. Unclear mechanism of injury or strain. Also asked about any sublingual hormones or estrogens and the patient denies. She isPERC negative and without any chest pain or shortness of breath. We'll discharge with a muscle relaxer.  I also encouraged the  patient to use muscle cream such as Aspercreme or icy hot. She understands the plan and is willing to comply. Will follow-up with her primary care doctor. ____________________________________________   FINAL CLINICAL IMPRESSION(S) / ED DIAGNOSES  Final diagnoses:  RUQ pain      Myrna Blazer, MD 06/13/15 2059  Patient counseled to not drive or operate any heavy machinery after taking the muscle relaxer.  Myrna Blazer, MD 06/13/15 2100

## 2015-06-13 NOTE — ED Notes (Signed)
Pt states RLQ abd pain, state she was sent from urgent care, denies any nausea or vomiting but states she has not eaten much today

## 2015-07-12 ENCOUNTER — Encounter: Payer: Self-pay | Admitting: Physician Assistant

## 2015-07-12 ENCOUNTER — Ambulatory Visit (INDEPENDENT_AMBULATORY_CARE_PROVIDER_SITE_OTHER): Payer: Commercial Managed Care - HMO | Admitting: Physician Assistant

## 2015-07-12 VITALS — BP 122/70 | HR 100 | Temp 98.2°F | Resp 16 | Wt 204.0 lb

## 2015-07-12 DIAGNOSIS — T7840XA Allergy, unspecified, initial encounter: Secondary | ICD-10-CM

## 2015-07-12 DIAGNOSIS — Z889 Allergy status to unspecified drugs, medicaments and biological substances status: Secondary | ICD-10-CM | POA: Diagnosis not present

## 2015-07-12 MED ORDER — HYDROXYZINE HCL 10 MG PO TABS: 10.0000 mg | ORAL_TABLET | Freq: Three times a day (TID) | ORAL | Status: AC | PRN

## 2015-07-12 NOTE — Patient Instructions (Signed)
Drug Allergy Allergic reactions to medicines are common. Some allergic reactions are mild. A delayed type of drug allergy that occurs 1 week or more after exposure to a medicine or vaccine is called serum sickness. A life-threatening, sudden (acute) allergic reaction that involves the whole body is called anaphylaxis. CAUSES  "True" drug allergies occur when there is an allergic reaction to a medicine. This is caused by overactivity of the immune system. First, the body becomes sensitized. The immune system is triggered by your first exposure to the medicine. Following this first exposure, future exposure to the same medicine may be life-threatening. Almost any medicine can cause an allergic reaction. Common ones are:  Penicillin.  Sulfonamides (sulfa drugs).  Local anesthetics.  X-ray dyes that contain iodine. SYMPTOMS  Common symptoms of a minor allergic reaction are:  Swelling around the mouth.  An itchy red rash or hives.  Vomiting or diarrhea. Anaphylaxis can cause swelling of the mouth and throat. This makes it difficult to breathe and swallow. Severe reactions can be fatal within seconds, even after exposure to only a trace amount of the drug that causes the reaction. HOME CARE INSTRUCTIONS  If you are unsure of what caused your reaction, write down:  The names of the medicines you took.  How much medicine you took.  How you took the medicine, such as whether you took a pill, injected the medicine, or applied it to your skin.  All of the things you ate and drank.  The date and time of your reaction.  The symptoms of the reaction.  You may want to follow up with an allergy specialist after the reaction has cleared in order to be tested to confirm the allergy. It is important to confirm that your reaction is an allergy, not just a side effect to the medicine. If you have a true allergy to a medicine, this may prevent that medicine and related medicines from being given to  you when you are very ill.  If you have hives or a rash:  Take medicines as directed by your caregiver.  You may use an over-the-counter antihistamine (diphenhydramine) as needed.  Apply cold compresses to the skin or take baths in cool water. Avoid hot baths or showers.  If you are severely allergic:  Continuous observation after a severe reaction may be needed. Hospitalization is often required.  Wear a medical alert bracelet or necklace stating your allergy.  You and your family must learn how to use an anaphylaxis kit or give an epinephrine injection to temporarily treat an emergency allergic reaction. If you have had a severe reaction, always carry your epinephrine injection or anaphylaxis kit with you. This can be lifesaving if you have a severe reaction.  Do not drive or perform tasks after treatment until the medicines used to treat your reaction have worn off, or until your caregiver says it is okay.  If you have a drug allergy that was confirmed by your health care provider:  Carry information about the drug allergy with you at all times.  Always check with a pharmacist before taking any over-the-counter medicine. SEEK MEDICAL CARE IF:   You think you had an allergic reaction. Symptoms usually start within 30 minutes after exposure.  Symptoms are getting worse rather than better.  You develop new symptoms.  The symptoms that brought you to your caregiver return. SEEK IMMEDIATE MEDICAL CARE IF:   You have swelling of the mouth, difficulty breathing, or wheezing.  You have a tight  feeling in your chest or throat.  You develop hives, swelling, or itching all over your body.  You develop severe vomiting or diarrhea.  You feel faint or pass out. This is an emergency. Use your epinephrine injection or anaphylaxis kit as you have been instructed. Call for emergency medical help. Even if you improve after the injection, you need to be examined at a hospital emergency  department. MAKE SURE YOU:   Understand these instructions.  Will watch your condition.  Will get help right away if you are not doing well or get worse.   This information is not intended to replace advice given to you by your health care provider. Make sure you discuss any questions you have with your health care provider.   Document Released: 03/30/2005 Document Revised: 04/20/2014 Document Reviewed: 10/30/2014 Elsevier Interactive Patient Education 2016 [employer]  

## 2015-07-12 NOTE — Progress Notes (Signed)
Patient: Melinda Sanders Female    DOB: 10/20/1975   [age] y.o.   MRN: [id number] Visit Date: 07/12/2015  Today's Provider: Margaretann LovelessJennifer M Mildred Tuccillo, PA-C   Chief Complaint  Patient presents with  . Rash   Subjective:    Rash The current episode started 1 to 4 weeks ago (3-4 weeks). The affected locations include the left arm and right arm (it appears around the body at different ). The rash is characterized by redness and itchiness. She was exposed to a new medication (Per patient she was switched to a new medication for her diabetes 6 weeks ago. Medication is Jentadueto.). Associated symptoms include diarrhea. Pertinent negatives include no congestion, cough, facial edema, fatigue, joint pain, rhinorrhea, shortness of breath, sore throat or vomiting. Past treatments include anti-itch cream. The treatment provided no relief.       No Known Allergies Previous Medications   ATORVASTATIN (LIPITOR) 20 MG TABLET    Take 20 mg by mouth every evening. Reported on 07/12/2015   CARISOPRODOL (SOMA) 350 MG TABLET    Take 1 tablet (350 mg total) by mouth 3 (three) times daily as needed for muscle spasms.   CARVEDILOL (COREG) 12.5 MG TABLET    Take 12.5 mg by mouth 2 (two) times daily.   FLUCONAZOLE (DIFLUCAN) 150 MG TABLET    Take 1 tablet (150 mg total) by mouth every 3 (three) days. For three doses   HYDROCHLOROTHIAZIDE (HYDRODIURIL) 25 MG TABLET    Take 25 mg by mouth daily.   JENTADUETO 2.5-500 MG TABS    Take 1 tablet by mouth 2 (two) times daily.   LISINOPRIL (PRINIVIL,ZESTRIL) 5 MG TABLET       NYSTATIN CREAM (MYCOSTATIN)    Apply 1 application topically 2 (two) times daily.    Review of Systems  Constitutional: Negative for fatigue.  HENT: Negative for congestion, rhinorrhea and sore throat.   Respiratory: Negative for cough and shortness of breath.   Gastrointestinal: Positive for diarrhea. Negative for vomiting.  Musculoskeletal: Negative for joint pain.  Skin: Positive for rash.     Social History  Substance Use Topics  . Smoking status: Former Smoker    Quit date: 06/24/2010  . Smokeless tobacco: Not on file  . Alcohol Use: No   Objective:   BP 122/70 mmHg  Pulse 100  Temp(Src) 98.2 F (36.8 C) (Oral)  Resp 16  Wt 204 lb (92.534 kg)  LMP 06/27/2015  Physical Exam  Constitutional: She appears well-developed and well-nourished. No distress.  Cardiovascular: Normal rate, regular rhythm and normal heart sounds.  Exam reveals no gallop and no friction rub.   No murmur heard. Pulmonary/Chest: Effort normal and breath sounds normal. No respiratory distress. She has no wheezes. She has no rales.  Skin: Rash noted. Rash is urticarial (started on left forearm while in the office). She is not diaphoretic.  Vitals reviewed.       Assessment & Plan:     1. Allergic reaction caused by a drug Will have her stop Jentadueto. I will not give her any prednisone due to diabetes. I will give her hydroxyzine as below for itching. I did advise her of drowsiness precautions with this medication. I have also given her a 7 day sample of Janumet 100 mg-1000 mg extended release. I did advise her to call the office if symptoms persist or do not improve or if they worsen. I did advise her to follow-up with Dr. Arlana Pouchate at the end of  next week once he returns to follow through with medication management for her diabetes. - hydrOXYzine (ATARAX/VISTARIL) 10 MG tablet; Take 1 tablet (10 mg total) by mouth 3 (three) times daily as needed.  Dispense: 30 tablet; Refill: 0       Margaretann Loveless, PA-C  [facility]

## 2016-05-27 ENCOUNTER — Encounter: Payer: Self-pay | Admitting: Obstetrics & Gynecology

## 2016-05-27 ENCOUNTER — Ambulatory Visit (INDEPENDENT_AMBULATORY_CARE_PROVIDER_SITE_OTHER): Payer: Commercial Managed Care - HMO | Admitting: Obstetrics & Gynecology

## 2016-05-27 VITALS — BP 118/84 | HR 94 | Resp 20 | Ht 65.0 in | Wt 203.0 lb

## 2016-05-27 DIAGNOSIS — Z01419 Encounter for gynecological examination (general) (routine) without abnormal findings: Secondary | ICD-10-CM

## 2016-05-27 DIAGNOSIS — Z1231 Encounter for screening mammogram for malignant neoplasm of breast: Secondary | ICD-10-CM

## 2016-05-27 DIAGNOSIS — Z0000 Encounter for general adult medical examination without abnormal findings: Secondary | ICD-10-CM | POA: Diagnosis not present

## 2016-05-27 DIAGNOSIS — E119 Type 2 diabetes mellitus without complications: Secondary | ICD-10-CM | POA: Insufficient documentation

## 2016-05-27 DIAGNOSIS — N763 Subacute and chronic vulvitis: Secondary | ICD-10-CM

## 2016-05-27 DIAGNOSIS — Z803 Family history of malignant neoplasm of breast: Secondary | ICD-10-CM

## 2016-05-27 MED ORDER — CLOBETASOL PROPIONATE 0.05 % EX OINT: TOPICAL_OINTMENT | CUTANEOUS | 5 refills | Status: DC

## 2016-05-27 NOTE — Progress Notes (Signed)
GYNECOLOGY ANNUAL PREVENTATIVE CARE ENCOUNTER NOTE  Subjective:   Melinda Sanders is a 41 y.o. [phone] female here for a routine annual gynecologic exam.  Current complaints: chronic and debilitating vulvar irritation and pruriotus especially at night for past three months. She had similar symptoms in 01/2015 and was thought to have a yeast infection; discharge analysis was negative and treatment with antifungals did not help.  The symptoms self-resolved until last three months.  No new bathing materials, clothing, habits or bed linen.  Itching occurs even if she is not in bed; it is so severe she only sleeps 2-3 hours a night. No associated discharge, bleeding or other symptoms. Only concentrated on her labia, not on her mons or inner thighs. She has not noticed any lesions. Has not had intercourse since symptoms started.   Around this same time, her periods have been a little irregular. Not heavy, but she misses periods occasionally and has noticed decrease in libido. She wonders if it all has to do with perimenopause, reports having periods in the 3rd grade and feeling she will go through early menopause; daughter has just been diagnosed with precocious puberty.  On an unrelated note, she is concerned about FH of breast cancer, desires BRCA testing.    Denies pelvic pain, problems with intercourse or other gynecologic concerns.    Gynecologic History Patient's last menstrual period was 05/04/2016. Contraception: none Last Pap: 05/16/2014. Results were: normal with negative HRHPV.  Obstetric History OB History  Gravida Para Term Preterm AB Living  3 2 0 _0 SAB TAB Ectopic Multiple Live Births  1 0 0 0 1    # Outcome Date GA Lbr Len/2nd Weight Sex Delivery Anes PTL Lv  3 Preterm 01/13/11 [redacted]w[redacted]d 5 lb 3.3 oz (2.362 kg) F CS-LTranv Spinal  LIV  2 SAB           1 Preterm               Past Medical History:  Diagnosis Date  . Diabetes mellitus without complication ([place]   . HTN  (hypertension)     Past Surgical History:  Procedure Laterality Date  . CERVICAL CERCLAGE  August 06, 2010  . CERVICAL CERCLAGE  January 13, 2003  . CESAREAN SECTION  May 26, 2003  . CESAREAN SECTION  01/13/2011   Procedure: CESAREAN SECTION;  Surgeon: GMarylynn Pearson  [facility];  Service: Gynecology;  Laterality: N/A;  . DILATION AND CURETTAGE OF UTERUS  June 15, 2002    Current Outpatient Prescriptions on File Prior to Visit  Medication Sig Dispense Refill  . atorvastatin (LIPITOR) 20 MG tablet Take 20 mg by mouth every evening. Reported on 07/12/2015  5  . carvedilol (COREG) 12.5 MG tablet Take 12.5 mg by mouth 2 (two) times daily.  5  . hydrochlorothiazide (HYDRODIURIL) 25 MG tablet Take 25 mg by mouth daily.  5  . hydrOXYzine (ATARAX/VISTARIL) 10 MG tablet Take 1 tablet (10 mg total) by mouth 3 (three) times daily as needed. 30 tablet 0  . lisinopril (PRINIVIL,ZESTRIL) 5 MG tablet      No current facility-administered medications on file prior to visit.     Allergies  Allergen Reactions  . Linagliptin Rash    Social History   Social History  . Marital status: Married    Spouse name: N/A  . Number of children: N/A  . Years of education: N/A   Occupational History  . Not on file.  Social History Main Topics  . Smoking status: Former Smoker    Quit date: 06/24/2010  . Smokeless tobacco: Never Used     Comment: Occasional use  . Alcohol use Yes     Comment: rarely  . Drug use: No  . Sexual activity: Not Currently    Birth control/ protection: Surgical     Comment: BTL   Other Topics Concern  . Not on file   Social History Narrative  . No narrative on file    Family History  Problem Relation Age of Onset  . Diabetes Mother   . Miscarriages / [place] Mother   . Hyperlipidemia Father   . Hypertension Father   . Cancer Maternal Grandmother   . Diabetes Maternal Grandmother   . Early death Maternal Grandmother   . Mental illness Maternal  Grandmother   . Cancer Paternal Grandmother   . Hypertension Paternal Grandfather     The following portions of the patient's history were reviewed and updated as appropriate: allergies, current medications, past family history, past medical history, past social history, past surgical history and problem list.  Review of Systems Pertinent items noted in HPI and remainder of comprehensive ROS otherwise negative.   Objective:  BP 118/84 (BP Location: Left Arm, Patient Position: Sitting, Cuff Size: Large)   Pulse 94   Resp 20   Ht _0  (1.651 m)   Wt 203 lb (92.1 kg)   LMP 05/04/2016   BMI 33.78 kg/m  CONSTITUTIONAL: Well-developed, well-nourished female in no acute distress.  HENT:  Normocephalic, atraumatic, External right and left ear normal. Oropharynx is clear and moist EYES: Conjunctivae and EOM are normal. Pupils are equal, round, and reactive to light. No scleral icterus.  NECK: Normal range of motion, supple, no masses.  Normal thyroid.  SKIN: Skin is warm and dry. No rash noted. Not diaphoretic. No erythema. No pallor. NEUROLOGIC: Alert and oriented to person, place, and time. Normal reflexes, muscle tone coordination. No cranial nerve deficit noted. PSYCHIATRIC: Normal mood and affect. Normal behavior. Normal judgment and thought content. CARDIOVASCULAR: Normal heart rate noted, regular rhythm RESPIRATORY: Clear to auscultation bilaterally. Effort and breath sounds normal, no problems with respiration noted. BREASTS: Symmetric in size. No masses, skin changes, nipple drainage, or lymphadenopathy. ABDOMEN: Soft, normal bowel sounds, no distention noted.  No tenderness, rebound or guarding.  PELVIC: Inflammed and excoriated labia minora and areas between the labia minora and majora.  No drainage, no lesions.  Just chronically inflammed and edematous.  Normal appearing vaginal mucosa and cervix.  No abnormal discharge noted.  Pap smear and vaginal discharge testing sample  obtained.  Normal uterine size, no other palpable masses, no uterine or adnexal tenderness. MUSCULOSKELETAL: Normal range of motion. No tenderness.  No cyanosis, clubbing, or edema.  2+ distal pulses.   Assessment and Plan:   1. 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 Will do trial of clobetasol.  Will re-test vaginal discharge to rule out infection and manage accordingly. - clobetasol ointment (TEMOVATE) 0.05 %; Apply to affected area every night for 4 weeks, then every other day for 4 weeks and then twice a week for 4 weeks or until resolution.  Dispense: 30 g; Refill: 5 - Cervicovaginal ancillary only  2. Visit for screening mammogram Mammogram scheduled - MM DIGITAL SCREENING BILATERAL; Future  3. Family history of breast cancer - Integrated [facility] ([place]) drawn   4. Encounter for gynecological examination with Papanicolaou smear of cervix Will  follow up results of pap smear and manage accordingly. - Cytology - PAP Reassured about other perimenopausal symptoms; bleeding precautions advised. Routine preventative health maintenance measures emphasized. Please refer to After Visit Summary for other counseling recommendations.    Verita Schneiders, MD, [place] Attending Obstetrician & Gynecologist, [place] for [facility]

## 2016-05-27 NOTE — Patient Instructions (Signed)

## 2016-05-27 NOTE — Progress Notes (Signed)
Pt here today for annual exam, c/o extreme dryness and itching that increases at night while trying to sleep.

## 2016-05-29 LAB — CYTOLOGY - PAP
DIAGNOSIS: NEGATIVE
HPV: NOT DETECTED

## 2016-05-29 LAB — CERVICOVAGINAL ANCILLARY ONLY
Bacterial vaginitis: NEGATIVE
CHLAMYDIA, DNA PROBE: NEGATIVE
Candida vaginitis: NEGATIVE
NEISSERIA GONORRHEA: NEGATIVE
Trichomonas: NEGATIVE

## 2016-06-11 ENCOUNTER — Ambulatory Visit: Payer: Commercial Managed Care - HMO | Admitting: Obstetrics & Gynecology

## 2016-06-22 ENCOUNTER — Ambulatory Visit
Admission: RE | Admit: 2016-06-22 | Discharge: 2016-06-22 | Disposition: A | Discharge status: Home / Self Care | Payer: Commercial Managed Care - HMO | Source: Ambulatory Visit | Attending: Obstetrics & Gynecology | Admitting: Obstetrics & Gynecology

## 2016-06-22 DIAGNOSIS — Z1231 Encounter for screening mammogram for malignant neoplasm of breast: Secondary | ICD-10-CM | POA: Insufficient documentation

## 2016-06-24 ENCOUNTER — Encounter: Payer: Self-pay | Admitting: *Deleted

## 2016-07-07 ENCOUNTER — Encounter: Payer: Self-pay | Admitting: *Deleted

## 2017-06-28 ENCOUNTER — Other Ambulatory Visit: Payer: Self-pay | Admitting: Obstetrics & Gynecology

## 2017-06-28 DIAGNOSIS — N763 Subacute and chronic vulvitis: Secondary | ICD-10-CM

## 2017-09-17 IMAGING — US US ABDOMEN LIMITED
1 series · 14 of 25 positions shown · non-contrast
Comparison: Ultrasound dated 09/13/2014

CLINICAL DATA: 39-year-old female with right upper quadrant
abdominal pain since yesterday

EXAM:
US ABDOMEN LIMITED - RIGHT UPPER QUADRANT

[Series 1: us abdomen limited · 0.24mm/px · 14 of 42 slices shown]
[im 1/42]
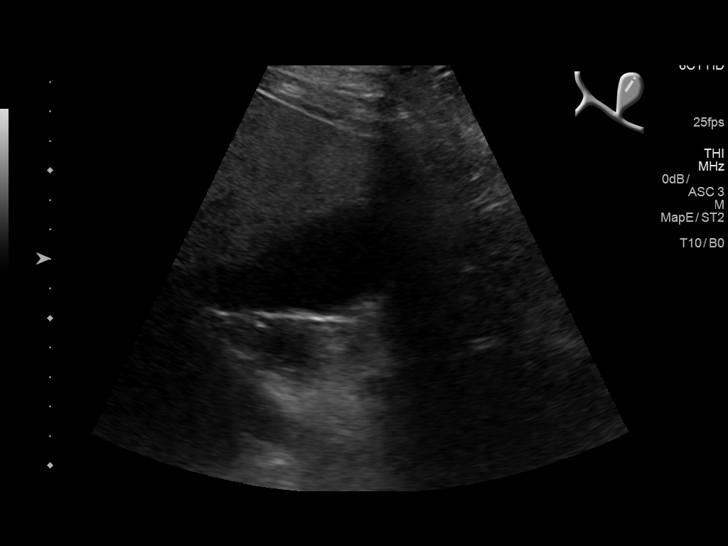
[im 4/42]
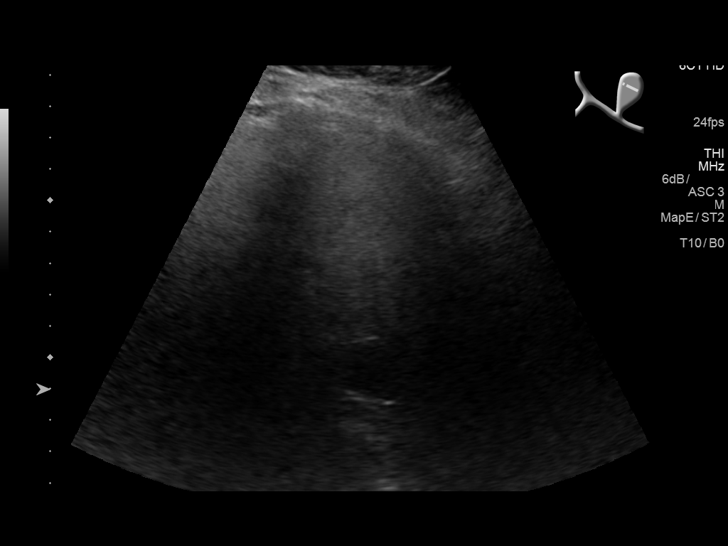
[im 7/42]
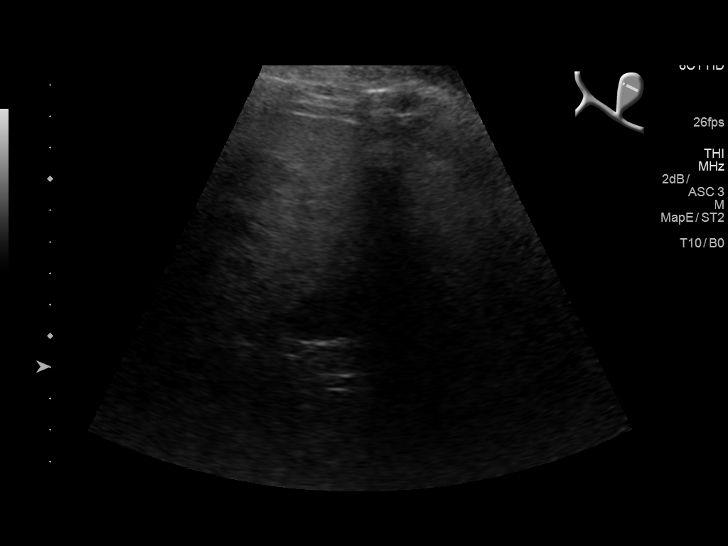
[im 11/42]
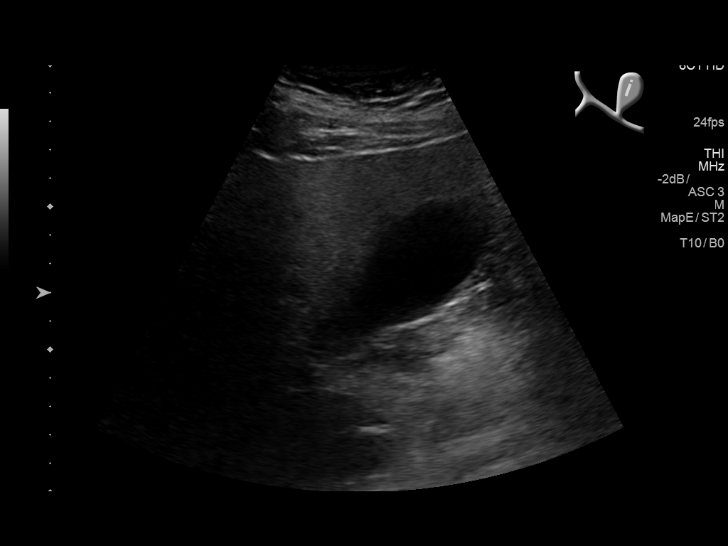
[im 14/42]
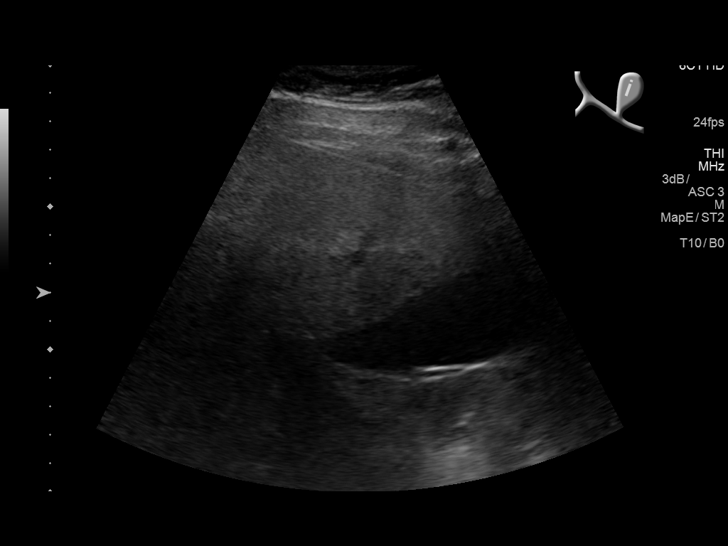
[im 16/42]
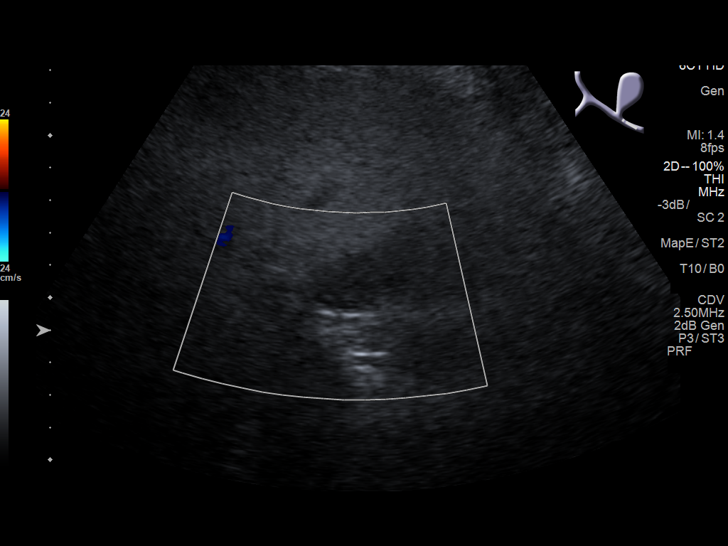
[im 19/42]
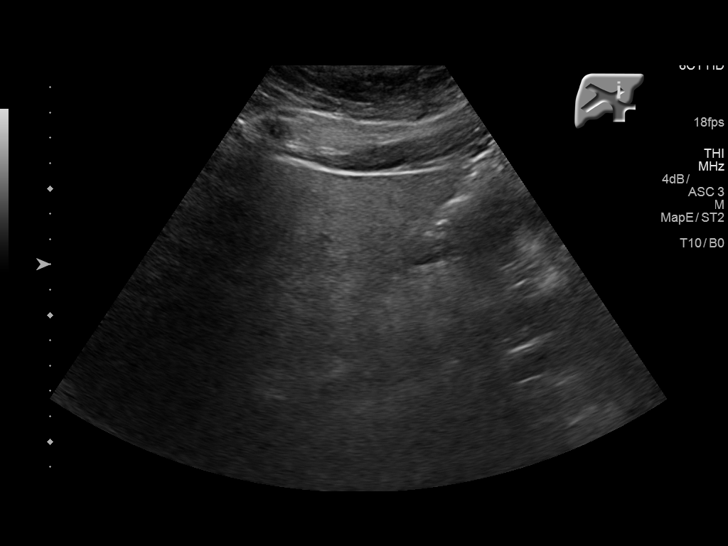
[im 23/42]
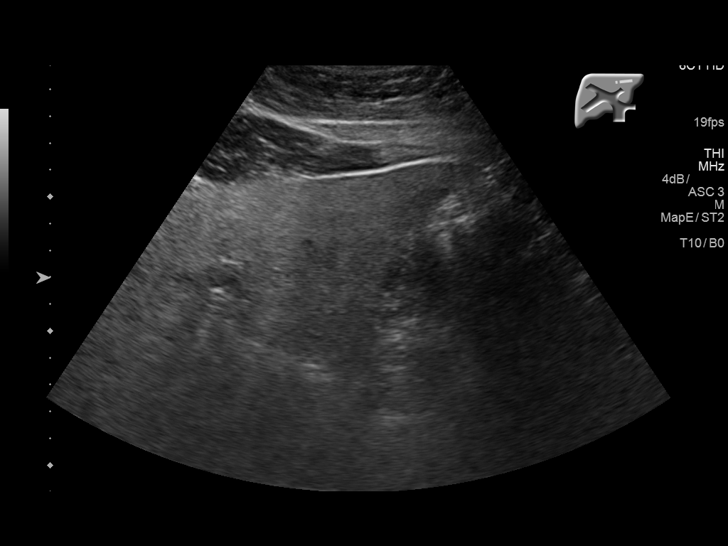
[im 26/42]
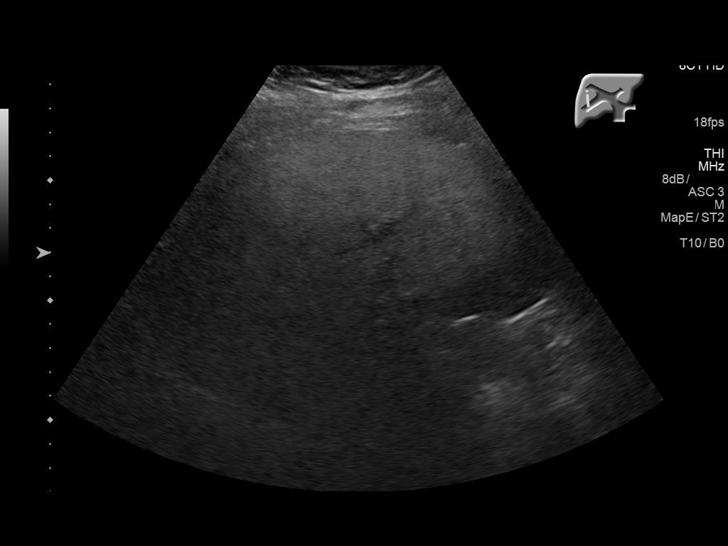
[im 28/42]
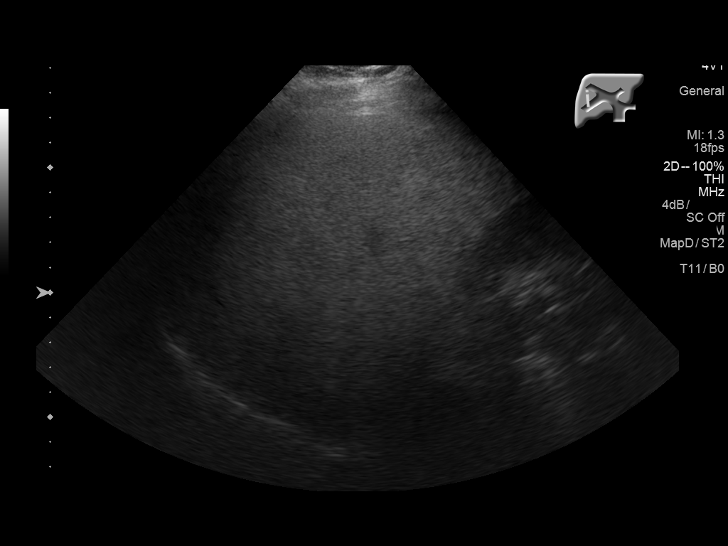
[im 31/42]
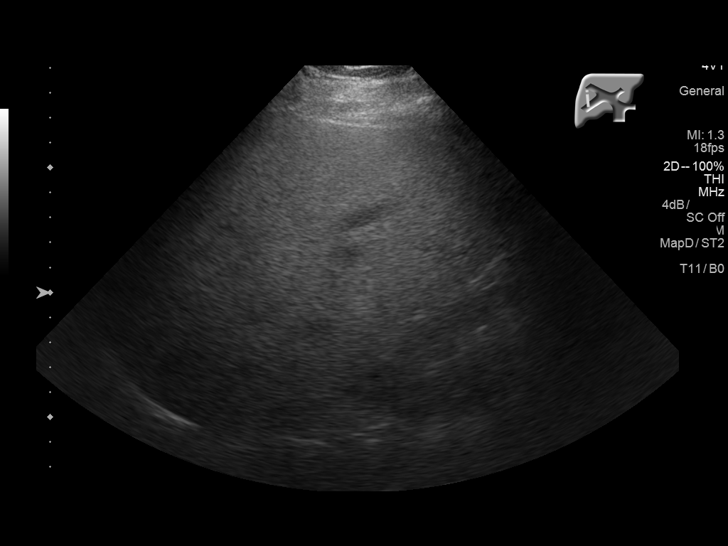
[im 35/42]
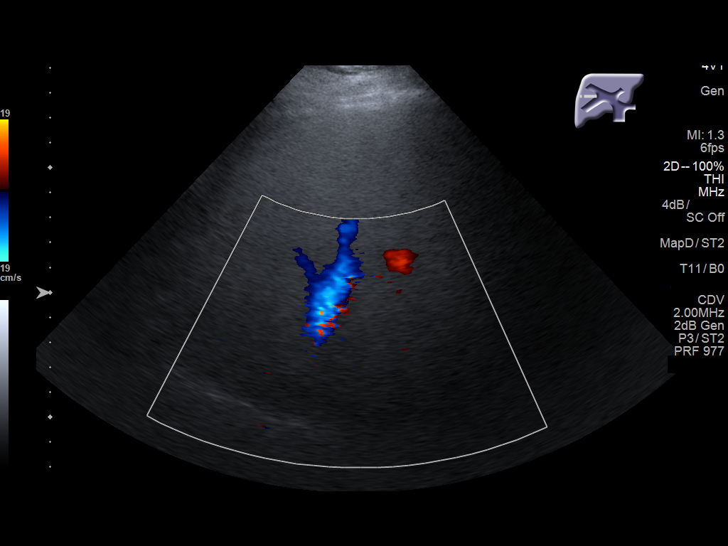
[im 38/42]
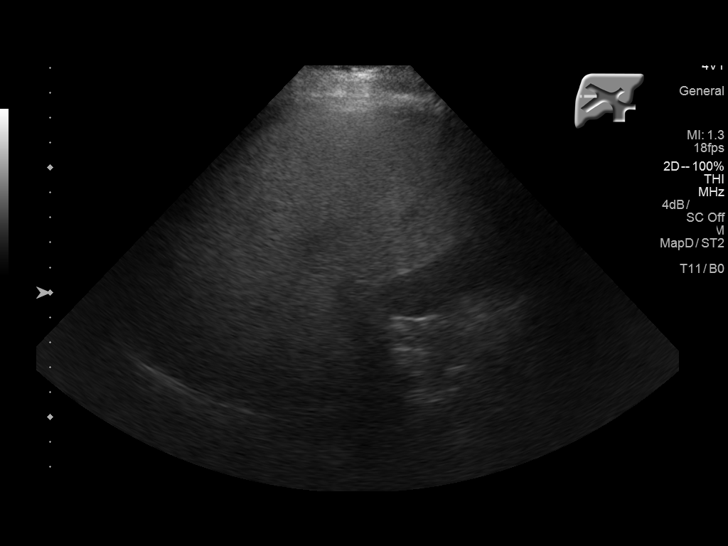
[im 42/42]
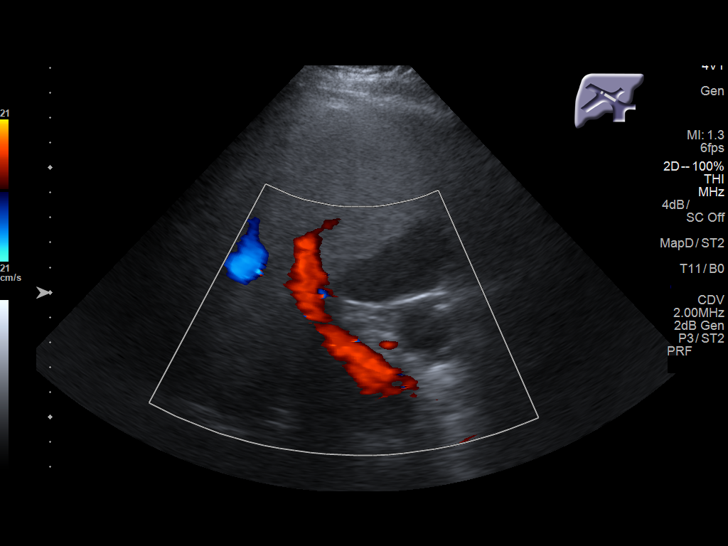

[14 of 25 positions shown; findings below may reference images not displayed]

FINDINGS: Gallbladder:

No gallstones or wall thickening visualized. No sonographic Murphy
sign noted by sonographer.

Common bile duct:

Diameter: 4 mm

Liver:

There is diffuse increased hepatic echotexture compatible with fatty
liver.
IMPRESSION: No gallstones.

Fatty liver.

## 2018-06-07 ENCOUNTER — Ambulatory Visit: Payer: Commercial Managed Care - HMO | Admitting: Obstetrics & Gynecology

## 2018-06-14 ENCOUNTER — Ambulatory Visit (INDEPENDENT_AMBULATORY_CARE_PROVIDER_SITE_OTHER): Payer: 59 | Admitting: Obstetrics & Gynecology

## 2018-06-14 ENCOUNTER — Encounter: Payer: Self-pay | Admitting: Obstetrics & Gynecology

## 2018-06-14 VITALS — BP 117/79 | HR 94 | Wt 185.0 lb

## 2018-06-14 DIAGNOSIS — Z1151 Encounter for screening for human papillomavirus (HPV): Secondary | ICD-10-CM | POA: Diagnosis not present

## 2018-06-14 DIAGNOSIS — Z124 Encounter for screening for malignant neoplasm of cervix: Secondary | ICD-10-CM

## 2018-06-14 DIAGNOSIS — N946 Dysmenorrhea, unspecified: Secondary | ICD-10-CM

## 2018-06-14 DIAGNOSIS — Z01419 Encounter for gynecological examination (general) (routine) without abnormal findings: Secondary | ICD-10-CM

## 2018-06-14 DIAGNOSIS — Z1231 Encounter for screening mammogram for malignant neoplasm of breast: Secondary | ICD-10-CM

## 2018-06-14 DIAGNOSIS — N898 Other specified noninflammatory disorders of vagina: Secondary | ICD-10-CM

## 2018-06-14 DIAGNOSIS — Z113 Encounter for screening for infections with a predominantly sexual mode of transmission: Secondary | ICD-10-CM

## 2018-06-14 MED ORDER — NAPROXEN 500 MG PO TABS: 500.0000 mg | ORAL_TABLET | Freq: Two times a day (BID) | ORAL | 2 refills | Status: AC

## 2018-06-14 NOTE — Progress Notes (Signed)
GYNECOLOGY ANNUAL PREVENTATIVE CARE ENCOUNTER NOTE  History:     Melinda Sanders is a 43 y.o. [phone] female here for a routine annual gynecologic exam.  Current complaints: dysmenorrhea for the past few months. Pain is severe, not alleviated even with maximum doses of Ibuprofen. Never had this before.  Pain is lower abdomen, especially on her left (has shooting pain periodically), and radiates to her back.   Denies abnormal vaginal bleeding, discharge, problems with intercourse or other gynecologic concerns.    Gynecologic History No LMP recorded. (Menstrual status: Irregular Periods). Contraception: tubal ligation Last Pap: 05/27/2016. Results were: normal with negative HPV Last mammogram: 06/22/2016. Results were: normal  Obstetric History OB History  Gravida Para Term Preterm AB Living  3 2 0 2 1 2   SAB TAB Ectopic Multiple Live Births  1 0 0 0 1    # Outcome Date GA Lbr Len/2nd Weight Sex Delivery Anes PTL Lv  3 Preterm 01/13/11 [redacted]w[redacted]d  5 lb 3.3 oz (2.362 kg) F CS-LTranv Spinal  LIV  2 SAB           1 Preterm             Past Medical History:  Diagnosis Date  . Diabetes mellitus without complication (HCC)   . HTN (hypertension)     Past Surgical History:  Procedure Laterality Date  . CERVICAL CERCLAGE  August 06, 2010  . CERVICAL CERCLAGE  January 13, 2003  . CESAREAN SECTION  May 26, 2003  . CESAREAN SECTION  01/13/2011   Procedure: CESAREAN SECTION;  Surgeon: Zelphia Cairo;  Location: [facility];  Service: Gynecology;  Laterality: N/A;  . DILATION AND CURETTAGE OF UTERUS  June 15, 2002    Current Outpatient Medications on File Prior to Visit  Medication Sig Dispense Refill  . carvedilol (COREG) 12.5 MG tablet Take 12.5 mg by mouth once.   5  . hydrochlorothiazide (HYDRODIURIL) 25 MG tablet Take 25 mg by mouth daily.  5  . levocetirizine (XYZAL) 5 MG tablet Take 5 mg by mouth daily.    Marland Kitchen lisinopril (PRINIVIL,ZESTRIL) 5 MG tablet     . metFORMIN (GLUCOPHAGE) 500 MG  tablet Take 500 mg by mouth 2 (two) times daily.    . NESINA 25 MG TABS Take 25 mg by mouth daily.    Marland Kitchen atorvastatin (LIPITOR) 20 MG tablet Take 10 mg by mouth every evening. Reported on 07/12/2015  5  . clobetasol ointment (TEMOVATE) 0.05 % APPLY TO AFFECTED AREA AT BEDTIME X 4WEEKS,THEN EVERY OTHER DAY X 4WEEKS THEN TWICE A WEEK X 4WEEKS (Patient not taking: Reported on 06/14/2018) 30 g 2  . hydrOXYzine (ATARAX/VISTARIL) 10 MG tablet Take 1 tablet (10 mg total) by mouth 3 (three) times daily as needed. (Patient not taking: Reported on 06/14/2018) 30 tablet 0   No current facility-administered medications on file prior to visit.     Allergies  Allergen Reactions  . Linagliptin Rash    Social History:  reports that she quit smoking about 7 years ago. She has never used smokeless tobacco. She reports current alcohol use. She reports that she does not use drugs.  Family History  Problem Relation Age of Onset  . Diabetes Mother   . Miscarriages / [place] Mother   . Hyperlipidemia Father   . Hypertension Father   . Diabetes Maternal Grandmother   . Early death Maternal Grandmother   . Mental illness Maternal Grandmother   . Breast cancer Maternal Grandmother   .  Breast cancer Paternal Grandmother   . Hypertension Paternal Grandfather   . Breast cancer Other   . Breast cancer Paternal Aunt     The following portions of the patient's history were reviewed and updated as appropriate: allergies, current medications, past family history, past medical history, past social history, past surgical history and problem list.  Review of Systems Pertinent items noted in HPI and remainder of comprehensive ROS otherwise negative.  Physical Exam:  BP 117/79   Pulse 94   Wt 185 lb (83.9 kg)   BMI 30.79 kg/m  CONSTITUTIONAL: Well-developed, well-nourished female in no acute distress.  HENT:  Normocephalic, atraumatic, External right and left ear normal. Oropharynx is clear and moist EYES:  Conjunctivae and EOM are normal. Pupils are equal, round, and reactive to light. No scleral icterus.  NECK: Normal range of motion, supple, no masses.  Normal thyroid.  SKIN: Skin is warm and dry. No rash noted. Not diaphoretic. No erythema. No pallor. MUSCULOSKELETAL: Normal range of motion. No tenderness.  No cyanosis, clubbing, or edema.  2+ distal pulses. NEUROLOGIC: Alert and oriented to person, place, and time. Normal reflexes, muscle tone coordination. No cranial nerve deficit noted. PSYCHIATRIC: Normal mood and affect. Normal behavior. Normal judgment and thought content. CARDIOVASCULAR: Normal heart rate noted, regular rhythm RESPIRATORY: Clear to auscultation bilaterally. Effort and breath sounds normal, no problems with respiration noted. BREASTS: Symmetric in size. No masses, skin changes, nipple drainage, or lymphadenopathy. ABDOMEN: Soft, normal bowel sounds, no distention noted.  Moderate LLQ tenderness on palpation, no other tenderness, rebound or guarding.  PELVIC: Normal appearing external genitalia; normal appearing vaginal mucosa and cervix.  [place] discharge noted, testing sample obtained.  Pap smear obtained.  Unable to adequately palpate uterine size or adnexa well, no other palpable masses.  No uterine tenderness but moderate left adnexal tenderness. .   Assessment and Plan:    1. Dysmenorrhea Discussed possible etiologies for dysmenorrhea; will check for infection or structural anomaly.  If there are no findings on these studies, may consider menstrual suppression with hormonal therapy (low dose OCPs).  Naproxen prescribed for pain as needed for now. - naproxen (NAPROSYN) 500 MG tablet; Take 1 tablet (500 mg total) by mouth 2 (two) times daily with a meal. As needed for pain  Dispense: 60 tablet; Refill: 2 - US PELVIC COMPLETE WITH TRANSVAGINAL; Future - Cervicovaginal ancillary only  2. Breast cancer screening by mammogram Mammogram scheduled - MM 3D SCREEN BREAST  BILATERAL; Future  3. Well woman exam with routine gynecological exam - Cytology - PAP( [place]) Will follow up results of pap smear and manage accordingly. Routine preventative health maintenance measures emphasized. Please refer to After Visit Summary for other counseling recommendations.      Jaynie Collins, MD, FACOG Obstetrician & Gynecologist, [facility] for [employer], [facility]

## 2018-06-14 NOTE — Patient Instructions (Signed)
Preventive Care 40-64 Years, Female Preventive care refers to lifestyle choices and visits with your health care provider that can promote health and wellness. What does preventive care include?   A yearly physical exam. This is also called an annual well check.  Dental exams once or twice a year.  Routine eye exams. Ask your health care provider how often you should have your eyes checked.  Personal lifestyle choices, including: ? Daily care of your teeth and gums. ? Regular physical activity. ? Eating a healthy diet. ? Avoiding tobacco and drug use. ? Limiting alcohol use. ? Practicing safe sex. ? Taking low-dose aspirin daily starting at age 50. ? Taking vitamin and mineral supplements as recommended by your health care provider. What happens during an annual well check? The services and screenings done by your health care provider during your annual well check will depend on your age, overall health, lifestyle risk factors, and family history of disease. Counseling Your health care provider may ask you questions about your:  Alcohol use.  Tobacco use.  Drug use.  Emotional well-being.  Home and relationship well-being.  Sexual activity.  Eating habits.  Work and work environment.  Method of birth control.  Menstrual cycle.  Pregnancy history. Screening You may have the following tests or measurements:  Height, weight, and BMI.  Blood pressure.  Lipid and cholesterol levels. These may be checked every 5 years, or more frequently if you are over 50 years old.  Skin check.  Lung cancer screening. You may have this screening every year starting at age 55 if you have a 30-pack-year history of smoking and currently smoke or have quit within the past 15 years.  Colorectal cancer screening. All adults should have this screening starting at age 50 and continuing until age 75. Your health care provider may recommend screening at age 45. You will have tests every  1-10 years, depending on your results and the type of screening test. People at increased risk should start screening at an earlier age. Screening tests may include: ? Guaiac-based fecal occult blood testing. ? Fecal immunochemical test (FIT). ? Stool DNA test. ? Virtual colonoscopy. ? Sigmoidoscopy. During this test, a flexible tube with a tiny camera (sigmoidoscope) is used to examine your rectum and lower colon. The sigmoidoscope is inserted through your anus into your rectum and lower colon. ? Colonoscopy. During this test, a long, thin, flexible tube with a tiny camera (colonoscope) is used to examine your entire colon and rectum.  Hepatitis C blood test.  Hepatitis B blood test.  Sexually transmitted disease (STD) testing.  Diabetes screening. This is done by checking your blood sugar (glucose) after you have not eaten for a while (fasting). You may have this done every 1-3 years.  Mammogram. This may be done every 1-2 years. Talk to your health care provider about when you should start having regular mammograms. This may depend on whether you have a family history of breast cancer.  BRCA-related cancer screening. This may be done if you have a family history of breast, ovarian, tubal, or peritoneal cancers.  Pelvic exam and Pap test. This may be done every 3 years starting at age 21. Starting at age 30, this may be done every 5 years if you have a Pap test in combination with an HPV test.  Bone density scan. This is done to screen for osteoporosis. You may have this scan if you are at high risk for osteoporosis. Discuss your test results, treatment options,   and if necessary, the need for more tests with your health care provider. Vaccines Your health care provider may recommend certain vaccines, such as:  Influenza vaccine. This is recommended every year.  Tetanus, diphtheria, and acellular pertussis (Tdap, Td) vaccine. You may need a Td booster every 10 years.  Varicella  vaccine. You may need this if you have not been vaccinated.  Zoster vaccine. You may need this after age 38.  Measles, mumps, and rubella (MMR) vaccine. You may need at least one dose of MMR if you were born in 1957 or later. You may also need a second dose.  Pneumococcal 13-valent conjugate (PCV13) vaccine. You may need this if you have certain conditions and were not previously vaccinated.  Pneumococcal polysaccharide (PPSV23) vaccine. You may need one or two doses if you smoke cigarettes or if you have certain conditions.  Meningococcal vaccine. You may need this if you have certain conditions.  Hepatitis A vaccine. You may need this if you have certain conditions or if you travel or work in places where you may be exposed to hepatitis A.  Hepatitis B vaccine. You may need this if you have certain conditions or if you travel or work in places where you may be exposed to hepatitis B.  Haemophilus influenzae type b (Hib) vaccine. You may need this if you have certain conditions. Talk to your health care provider about which screenings and vaccines you need and how often you need them. This information is not intended to replace advice given to you by your health care provider. Make sure you discuss any questions you have with your health care provider. Document Released: 04/26/2015 Document Revised: 05/20/2017 Document Reviewed: 01/29/2015 Elsevier Interactive Patient Education  2019 [employer].

## 2018-06-16 LAB — CYTOLOGY - PAP
Diagnosis: NEGATIVE
HPV (WINDOPATH): NOT DETECTED

## 2018-06-16 LAB — CERVICOVAGINAL ANCILLARY ONLY
Bacterial vaginitis: NEGATIVE
CHLAMYDIA, DNA PROBE: NEGATIVE
Candida vaginitis: NEGATIVE
Neisseria Gonorrhea: NEGATIVE
Trichomonas: NEGATIVE

## 2018-06-21 ENCOUNTER — Ambulatory Visit: Payer: 59

## 2018-06-27 ENCOUNTER — Other Ambulatory Visit: Payer: Self-pay

## 2018-06-27 ENCOUNTER — Ambulatory Visit
Admission: RE | Admit: 2018-06-27 | Discharge: 2018-06-27 | Disposition: A | Discharge status: Home / Self Care | Payer: 59 | Source: Ambulatory Visit | Attending: Obstetrics & Gynecology | Admitting: Obstetrics & Gynecology

## 2018-06-27 DIAGNOSIS — Z1231 Encounter for screening mammogram for malignant neoplasm of breast: Secondary | ICD-10-CM | POA: Diagnosis present

## 2018-09-10 ENCOUNTER — Other Ambulatory Visit: Payer: Self-pay | Admitting: Obstetrics & Gynecology

## 2018-09-10 DIAGNOSIS — N763 Subacute and chronic vulvitis: Secondary | ICD-10-CM

## 2018-12-02 ENCOUNTER — Other Ambulatory Visit: Payer: Self-pay

## 2018-12-02 DIAGNOSIS — Z20822 Contact with and (suspected) exposure to covid-19: Secondary | ICD-10-CM

## 2018-12-03 LAB — NOVEL CORONAVIRUS, NAA: SARS-CoV-2, NAA: NOT DETECTED

## 2019-04-12 ENCOUNTER — Encounter: Payer: Self-pay | Admitting: Family Medicine

## 2019-04-12 ENCOUNTER — Ambulatory Visit (INDEPENDENT_AMBULATORY_CARE_PROVIDER_SITE_OTHER): Payer: 59 | Admitting: Family Medicine

## 2019-04-12 ENCOUNTER — Other Ambulatory Visit: Payer: Self-pay

## 2019-04-12 VITALS — BP 126/88 | HR 99 | Ht 65.0 in | Wt 197.0 lb

## 2019-04-12 DIAGNOSIS — R2232 Localized swelling, mass and lump, left upper limb: Secondary | ICD-10-CM

## 2019-04-12 NOTE — Progress Notes (Signed)
Bump in left axillae x 2 weeks Had a mammo in march 2020-WNL

## 2019-04-12 NOTE — Progress Notes (Signed)
   GYNECOLOGY PROBLEM  VISIT ENCOUNTER NOTE  Subjective:   Melinda Sanders is a 43 y.o. [phone] female here for a a problem GYN visit.   Chief Complaint  Patient presents with  . Mass     Current complaints: lump under left arm present for several weeks. Area is painful and this started - intemittent for several weeks. Yesterday she reports acute worsening and could not put perssure on the area. Did not noticed increased warmth. Denies fevers. Denis trauma to the area. Not associated with recent shaving.  Thinks maybe she had something similar but was related to underwire and was years ago.   Denies abnormal vaginal bleeding, discharge, pelvic pain, problems with intercourse or other gynecologic concerns. Has high risk family history of breast cancer but recent mamogram in 06/2018 was WNL   Gynecologic History Patient's last menstrual period was 03/22/2019 (approximate).   Health Maintenance Due  Topic Date Due  . HEMOGLOBIN A1C  1975/12/06  . PNEUMOCOCCAL POLYSACCHARIDE VACCINE AGE 27-64 HIGH RISK  01/13/1978  . FOOT EXAM  01/13/1986  . OPHTHALMOLOGY EXAM  01/13/1986  . HIV Screening  01/14/1991  . INFLUENZA VACCINE  11/12/2018     The following portions of the patient's history were reviewed and updated as appropriate: allergies, current medications, past family history, past medical history, past social history, past surgical history and problem list.  Review of Systems Pertinent items are noted in HPI.   Objective:  BP 126/88   Pulse 99   Ht 5\' 5"  (1.651 m)   Wt 197 lb (89.4 kg)   LMP 03/22/2019 (Approximate)   BMI 32.78 kg/m  Gen: well appearing, NAD HEENT: no scleral icterus CV: RR Lung: Normal WOB Ext: warm well perfused  Left axilla:  Located in axillary crease there is a 1 cm tender to palpable nodule. Non-fluctuant. No around erythema. Very tender. No other masses in axilla.    Assessment and Plan:   1. Axillary mass, left Likely inflamed lymph node vs abscess.  given high risk family history will get [place] for area to assure normal appearing nature of mass and possible bx based on breast center evaluation.  - US Breast and Axilla Limited Left; Future - Mammogram Diagnostic Bilateral Tomo; Future   Please refer to After Visit Summary for other counseling recommendations.   Return after [place].  Caren Macadam, MD, MPH, ABFM Attending [place] for [facility]

## 2019-04-21 ENCOUNTER — Ambulatory Visit
Admission: RE | Admit: 2019-04-21 | Discharge: 2019-04-21 | Disposition: A | Discharge status: Home / Self Care | Payer: 59 | Source: Ambulatory Visit | Attending: Family Medicine | Admitting: Family Medicine

## 2019-04-21 DIAGNOSIS — R2232 Localized swelling, mass and lump, left upper limb: Secondary | ICD-10-CM

## 2019-05-04 ENCOUNTER — Encounter: Payer: Self-pay | Admitting: Radiology

## 2019-06-13 ENCOUNTER — Other Ambulatory Visit: Payer: Self-pay | Admitting: Internal Medicine

## 2019-06-13 DIAGNOSIS — R1013 Epigastric pain: Secondary | ICD-10-CM

## 2019-06-16 ENCOUNTER — Ambulatory Visit
Admission: RE | Admit: 2019-06-16 | Discharge: 2019-06-16 | Disposition: A | Discharge status: Home / Self Care | Payer: 59 | Source: Ambulatory Visit | Attending: Internal Medicine | Admitting: Internal Medicine

## 2019-06-16 ENCOUNTER — Other Ambulatory Visit: Payer: Self-pay

## 2019-06-16 DIAGNOSIS — R1013 Epigastric pain: Secondary | ICD-10-CM | POA: Diagnosis present

## 2019-10-28 ENCOUNTER — Other Ambulatory Visit: Payer: Self-pay | Admitting: Obstetrics & Gynecology

## 2019-10-28 DIAGNOSIS — N763 Subacute and chronic vulvitis: Secondary | ICD-10-CM

## 2021-01-13 ENCOUNTER — Other Ambulatory Visit: Payer: Self-pay | Admitting: Obstetrics & Gynecology

## 2021-01-13 DIAGNOSIS — N763 Subacute and chronic vulvitis: Secondary | ICD-10-CM

## 2022-03-11 ENCOUNTER — Ambulatory Visit
Admission: RE | Admit: 2022-03-11 | Discharge: 2022-03-11 | Disposition: A | Discharge status: Home / Self Care | Payer: 59 | Attending: Internal Medicine | Admitting: Internal Medicine

## 2022-03-11 ENCOUNTER — Ambulatory Visit
Admission: RE | Admit: 2022-03-11 | Discharge: 2022-03-11 | Disposition: A | Discharge status: Home / Self Care | Payer: 59 | Source: Ambulatory Visit | Attending: Internal Medicine | Admitting: Internal Medicine

## 2022-03-11 ENCOUNTER — Other Ambulatory Visit: Payer: Self-pay | Admitting: Internal Medicine

## 2022-03-11 DIAGNOSIS — R059 Cough, unspecified: Secondary | ICD-10-CM

## 2022-06-15 ENCOUNTER — Other Ambulatory Visit: Payer: Self-pay | Admitting: Internal Medicine

## 2022-06-15 DIAGNOSIS — Z1231 Encounter for screening mammogram for malignant neoplasm of breast: Secondary | ICD-10-CM

## 2022-07-01 ENCOUNTER — Ambulatory Visit
Admission: RE | Admit: 2022-07-01 | Discharge: 2022-07-01 | Disposition: A | Discharge status: Home / Self Care | Payer: No Typology Code available for payment source | Source: Ambulatory Visit | Attending: Internal Medicine | Admitting: Internal Medicine

## 2022-07-01 DIAGNOSIS — Z1231 Encounter for screening mammogram for malignant neoplasm of breast: Secondary | ICD-10-CM | POA: Diagnosis not present
